# Patient Record
Sex: Female | Born: 1975 | Hispanic: No | Marital: Married | Smoking: Never smoker
Health system: Southern US, Community
[De-identification: ages and names within clinical notes are randomized; demographics above are authoritative.]

## PROBLEM LIST (undated history)

## (undated) ENCOUNTER — Inpatient Hospital Stay (HOSPITAL_COMMUNITY): Payer: Self-pay

## (undated) ENCOUNTER — Emergency Department (HOSPITAL_BASED_OUTPATIENT_CLINIC_OR_DEPARTMENT_OTHER): Admission: EM | Discharge status: Absent without leave | Payer: BLUE CROSS/BLUE SHIELD

## (undated) DIAGNOSIS — O09529 Supervision of elderly multigravida, unspecified trimester: Secondary | ICD-10-CM

## (undated) DIAGNOSIS — N764 Abscess of vulva: Secondary | ICD-10-CM

## (undated) DIAGNOSIS — N90810 Female genital mutilation status, unspecified: Secondary | ICD-10-CM

## (undated) DIAGNOSIS — O24419 Gestational diabetes mellitus in pregnancy, unspecified control: Secondary | ICD-10-CM

## (undated) DIAGNOSIS — I10 Essential (primary) hypertension: Secondary | ICD-10-CM

## (undated) HISTORY — DX: Abscess of vulva: N76.4

## (undated) HISTORY — DX: Female genital mutilation status, unspecified: N90.810

## (undated) HISTORY — DX: Essential (primary) hypertension: I10

## (undated) HISTORY — PX: NO PAST SURGERIES: SHX2092

## (undated) HISTORY — DX: Supervision of elderly multigravida, unspecified trimester: O09.529

---

## 2010-10-01 ENCOUNTER — Emergency Department (HOSPITAL_COMMUNITY)
Admission: EM | Admit: 2010-10-01 | Discharge: 2010-10-01 | Disposition: A | Discharge status: Home / Self Care | Payer: Self-pay | Attending: Emergency Medicine | Admitting: Emergency Medicine

## 2010-10-01 DIAGNOSIS — N9489 Other specified conditions associated with female genital organs and menstrual cycle: Secondary | ICD-10-CM | POA: Insufficient documentation

## 2010-10-01 DIAGNOSIS — N751 Abscess of Bartholin's gland: Secondary | ICD-10-CM | POA: Insufficient documentation

## 2011-02-21 ENCOUNTER — Other Ambulatory Visit (HOSPITAL_COMMUNITY): Payer: Self-pay | Admitting: Physician Assistant

## 2011-02-21 DIAGNOSIS — Z3689 Encounter for other specified antenatal screening: Secondary | ICD-10-CM

## 2011-02-21 LAB — RUBELLA ANTIBODY, IGM: Rubella: IMMUNE

## 2011-02-21 LAB — ABO/RH

## 2011-02-21 LAB — URINE CULTURE: Urine Culture, Routine: NO GROWTH

## 2011-02-21 LAB — CBC: Hemoglobin: 11.4 g/dL — AB (ref 12.0–16.0)

## 2011-02-21 LAB — HEPATITIS B SURFACE ANTIGEN: Hepatitis B Surface Ag: NEGATIVE

## 2011-02-21 LAB — HIV ANTIBODY (ROUTINE TESTING W REFLEX): HIV: NONREACTIVE

## 2011-03-06 DIAGNOSIS — Z2839 Other underimmunization status: Secondary | ICD-10-CM | POA: Insufficient documentation

## 2011-03-06 DIAGNOSIS — I10 Essential (primary) hypertension: Secondary | ICD-10-CM

## 2011-03-06 DIAGNOSIS — O169 Unspecified maternal hypertension, unspecified trimester: Secondary | ICD-10-CM | POA: Insufficient documentation

## 2011-03-06 DIAGNOSIS — O09899 Supervision of other high risk pregnancies, unspecified trimester: Secondary | ICD-10-CM | POA: Insufficient documentation

## 2011-03-06 DIAGNOSIS — N90810 Female genital mutilation status, unspecified: Secondary | ICD-10-CM

## 2011-03-06 DIAGNOSIS — O09529 Supervision of elderly multigravida, unspecified trimester: Secondary | ICD-10-CM

## 2011-03-07 ENCOUNTER — Ambulatory Visit (HOSPITAL_COMMUNITY)
Admission: RE | Admit: 2011-03-07 | Discharge: 2011-03-07 | Disposition: A | Discharge status: Home / Self Care | Payer: Medicaid Other | Source: Ambulatory Visit | Attending: Physician Assistant | Admitting: Physician Assistant

## 2011-03-07 DIAGNOSIS — Z3689 Encounter for other specified antenatal screening: Secondary | ICD-10-CM | POA: Insufficient documentation

## 2011-03-08 ENCOUNTER — Ambulatory Visit (INDEPENDENT_AMBULATORY_CARE_PROVIDER_SITE_OTHER): Payer: Medicaid Other | Admitting: Obstetrics & Gynecology

## 2011-03-08 VITALS — BP 130/85 | Temp 97.1°F | Wt 150.1 lb

## 2011-03-08 DIAGNOSIS — R030 Elevated blood-pressure reading, without diagnosis of hypertension: Secondary | ICD-10-CM

## 2011-03-08 LAB — POCT URINALYSIS DIP (DEVICE)
Bilirubin Urine: NEGATIVE
Hgb urine dipstick: NEGATIVE
Nitrite: NEGATIVE
pH: 6 (ref 5.0–8.0)

## 2011-03-08 LAB — COMPREHENSIVE METABOLIC PANEL
BUN: 7 mg/dL (ref 6–23)
CO2: 21 mEq/L (ref 19–32)
Calcium: 9.3 mg/dL (ref 8.4–10.5)
Chloride: 104 mEq/L (ref 96–112)
Creat: 0.37 mg/dL — ABNORMAL LOW (ref 0.50–1.10)
Glucose, Bld: 91 mg/dL (ref 70–99)

## 2011-03-08 NOTE — Progress Notes (Addendum)
Nutrition Note: Seen for 1st visit; pt receives [facility] services. Dx. HTN. Current wt gain of 5# plots 3#< expected for wks gestation. Pt reports good intake of 3 meals and 1-2 snacks daily. No reported food allergies and no N/V. Discussed wt gain goals of 25-35#. Pt will make try to increase snack intake to 2-3 small snacks daily. Follow up 4-6 weeks. Cy Blamer, RD

## 2011-03-08 NOTE — Progress Notes (Signed)
Received flu vaccine from [facility].  Pulse-98.

## 2011-03-08 NOTE — Progress Notes (Signed)
Referred due to borderline high BP 10/24 at [facility]. 139/89 that day. No previous Hx per Pt. Good FM. [place] 11/7 19.3, EDD 07/29/11. Could be seen in [facility] for now and watch for evidence of HTN. Baseline CMP today

## 2011-03-13 ENCOUNTER — Telehealth: Payer: Self-pay | Admitting: *Deleted

## 2011-03-13 NOTE — Telephone Encounter (Signed)
Pt left message stating that she has been having a cough since yesterday. She wants to know if her cough will upset her baby. I returned pt call and informed her that the cough will not upset or harm her baby. I also confirmed her EDD = 07/29/2011, now 20.2wks. I advised her that she may take the following OTC meds for cough; Robitussin DM, Delsym and Mucinex for chest congestion. Pt voiced understanding and had no further questions or c/o.

## 2011-03-29 ENCOUNTER — Ambulatory Visit (INDEPENDENT_AMBULATORY_CARE_PROVIDER_SITE_OTHER): Payer: Medicaid Other | Admitting: Obstetrics & Gynecology

## 2011-03-29 ENCOUNTER — Encounter: Payer: Self-pay | Admitting: *Deleted

## 2011-03-29 ENCOUNTER — Other Ambulatory Visit: Payer: Self-pay | Admitting: Obstetrics and Gynecology

## 2011-03-29 DIAGNOSIS — O359XX0 Maternal care for (suspected) fetal abnormality and damage, unspecified, not applicable or unspecified: Secondary | ICD-10-CM | POA: Insufficient documentation

## 2011-03-29 DIAGNOSIS — I10 Essential (primary) hypertension: Secondary | ICD-10-CM

## 2011-03-29 DIAGNOSIS — O09529 Supervision of elderly multigravida, unspecified trimester: Secondary | ICD-10-CM

## 2011-03-29 DIAGNOSIS — O0990 Supervision of high risk pregnancy, unspecified, unspecified trimester: Secondary | ICD-10-CM | POA: Insufficient documentation

## 2011-03-29 DIAGNOSIS — O358XX0 Maternal care for other (suspected) fetal abnormality and damage, not applicable or unspecified: Secondary | ICD-10-CM

## 2011-03-29 LAB — POCT URINALYSIS DIP (DEVICE)
Hgb urine dipstick: NEGATIVE
Ketones, ur: NEGATIVE mg/dL
Protein, ur: NEGATIVE mg/dL
Specific Gravity, Urine: 1.015 (ref 1.005–1.030)
Urobilinogen, UA: 0.2 mg/dL (ref 0.0–1.0)
pH: 6.5 (ref 5.0–8.0)

## 2011-03-29 NOTE — Progress Notes (Signed)
Need baseline labs and 24 hour urine.  Need fetal echo for ?VSD on [place].  BP stable.  No need to start antihypertensive meds yet.

## 2011-03-29 NOTE — Progress Notes (Signed)
Nutrition Note: (F/U on wt gain) Pt has gained 5 # since previous visit on (11/08).   Pt now has adequate wt gain @ [redacted]w[redacted]d gestation. Pt reports good appetite and no N/V.  Follow up if referred.  Cy Blamer, RD

## 2011-03-29 NOTE — Progress Notes (Signed)
P=125,C/o pelvic pain"sometimes"=1,

## 2011-03-29 NOTE — Progress Notes (Signed)
Fetal Echo scheduled with Dr. Elizebeth Brooking on !06/16/10 at 1030 am.

## 2011-04-04 ENCOUNTER — Encounter: Payer: Self-pay | Admitting: *Deleted

## 2011-04-09 ENCOUNTER — Telehealth: Payer: Self-pay | Admitting: *Deleted

## 2011-04-09 NOTE — Telephone Encounter (Signed)
Patient called and left a message on voicemail on Friday 04/06/11 stating would like to talk to a nurse about 24 hour urine. Called patient and left a message we are returning her call- please call back.

## 2011-04-10 NOTE — Telephone Encounter (Signed)
Called pt and left message stating that this is our second attempt if she still has questions to please give [place] a return call back.  Pt has appt on 04/12/11 @ 0900.  Will put in appt comments to check in basket.

## 2011-04-12 ENCOUNTER — Ambulatory Visit (INDEPENDENT_AMBULATORY_CARE_PROVIDER_SITE_OTHER): Payer: Medicaid Other | Admitting: Obstetrics & Gynecology

## 2011-04-12 DIAGNOSIS — O169 Unspecified maternal hypertension, unspecified trimester: Secondary | ICD-10-CM

## 2011-04-12 DIAGNOSIS — O358XX0 Maternal care for other (suspected) fetal abnormality and damage, not applicable or unspecified: Secondary | ICD-10-CM

## 2011-04-12 DIAGNOSIS — O0990 Supervision of high risk pregnancy, unspecified, unspecified trimester: Secondary | ICD-10-CM

## 2011-04-12 DIAGNOSIS — O359XX0 Maternal care for (suspected) fetal abnormality and damage, unspecified, not applicable or unspecified: Secondary | ICD-10-CM

## 2011-04-12 DIAGNOSIS — O09529 Supervision of elderly multigravida, unspecified trimester: Secondary | ICD-10-CM

## 2011-04-12 LAB — CBC
HCT: 36.2 % (ref 36.0–46.0)
Hemoglobin: 11.6 g/dL — ABNORMAL LOW (ref 12.0–15.0)
MCH: 25.7 pg — ABNORMAL LOW (ref 26.0–34.0)
MCHC: 32 g/dL (ref 30.0–36.0)
RBC: 4.51 MIL/uL (ref 3.87–5.11)

## 2011-04-12 LAB — POCT URINALYSIS DIP (DEVICE)
Hgb urine dipstick: NEGATIVE
Nitrite: NEGATIVE
Protein, ur: NEGATIVE mg/dL
Specific Gravity, Urine: 1.02 (ref 1.005–1.030)
Urobilinogen, UA: 0.2 mg/dL (ref 0.0–1.0)
pH: 6 (ref 5.0–8.0)

## 2011-04-12 LAB — COMPREHENSIVE METABOLIC PANEL
Albumin: 3.5 g/dL (ref 3.5–5.2)
Alkaline Phosphatase: 65 U/L (ref 39–117)
BUN: 7 mg/dL (ref 6–23)
CO2: 21 mEq/L (ref 19–32)
Glucose, Bld: 104 mg/dL — ABNORMAL HIGH (ref 70–99)
Sodium: 138 mEq/L (ref 135–145)
Total Bilirubin: 0.2 mg/dL — ABNORMAL LOW (ref 0.3–1.2)
Total Protein: 6.3 g/dL (ref 6.0–8.3)

## 2011-04-12 NOTE — Progress Notes (Signed)
Returned 24 hour urine today, will check labs. No preeclampsia symptoms.  No other complaints or concerns.  Fetal movement, blood pressure and labor precautions reviewed.

## 2011-04-12 NOTE — Progress Notes (Signed)
No vaginal discharge. Pulse 110. Pt returned 24 hour urine today and also needs lab work.

## 2011-04-13 ENCOUNTER — Encounter: Payer: Self-pay | Admitting: Obstetrics & Gynecology

## 2011-04-13 LAB — CREATININE CLEARANCE, URINE, 24 HOUR
Creatinine Clearance: 160 mL/min — ABNORMAL HIGH (ref 75–115)
Creatinine: 0.38 mg/dL — ABNORMAL LOW (ref 0.50–1.10)

## 2011-04-16 ENCOUNTER — Encounter: Payer: Self-pay | Admitting: *Deleted

## 2011-04-26 ENCOUNTER — Ambulatory Visit (INDEPENDENT_AMBULATORY_CARE_PROVIDER_SITE_OTHER): Payer: Medicaid Other | Admitting: Obstetrics and Gynecology

## 2011-04-26 ENCOUNTER — Other Ambulatory Visit: Payer: Self-pay | Admitting: Obstetrics and Gynecology

## 2011-04-26 DIAGNOSIS — O0990 Supervision of high risk pregnancy, unspecified, unspecified trimester: Secondary | ICD-10-CM

## 2011-04-26 DIAGNOSIS — O169 Unspecified maternal hypertension, unspecified trimester: Secondary | ICD-10-CM

## 2011-04-26 DIAGNOSIS — O09899 Supervision of other high risk pregnancies, unspecified trimester: Secondary | ICD-10-CM

## 2011-04-26 DIAGNOSIS — O358XX0 Maternal care for other (suspected) fetal abnormality and damage, not applicable or unspecified: Secondary | ICD-10-CM

## 2011-04-26 DIAGNOSIS — O359XX0 Maternal care for (suspected) fetal abnormality and damage, unspecified, not applicable or unspecified: Secondary | ICD-10-CM

## 2011-04-26 DIAGNOSIS — O09529 Supervision of elderly multigravida, unspecified trimester: Secondary | ICD-10-CM

## 2011-04-26 LAB — POCT URINALYSIS DIP (DEVICE)
Bilirubin Urine: NEGATIVE
Glucose, UA: NEGATIVE mg/dL
Hgb urine dipstick: NEGATIVE
Nitrite: NEGATIVE
Specific Gravity, Urine: 1.02 (ref 1.005–1.030)
Urobilinogen, UA: 0.2 mg/dL (ref 0.0–1.0)

## 2011-04-26 MED ORDER — CEPHALEXIN 500 MG PO CAPS: 500.0000 mg | ORAL_CAPSULE | Freq: Three times a day (TID) | ORAL | Status: AC

## 2011-04-26 NOTE — Progress Notes (Signed)
Pelvic pressure. Pulse 100. Pt states has abscess on left side of vaginal area and would liked that checked today.  No vaginal discharge.

## 2011-04-26 NOTE — Progress Notes (Signed)
Patient doing well. Reports an abscess on her left labia. Patient states it was present a few weeks ago and drained but now has returned. 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 1.5cm left labial cyst tender to touch located in upper aspect of labia majora, not ready to be drained. Will prescribe keflex. Patient advised to apply warm compresses to area in order to help form the abscess.

## 2011-05-01 NOTE — L&D Delivery Note (Signed)
Delivery Note At 11:54 PM a viable female was delivered via Vaginal, Spontaneous Delivery (Presentation: Left Occiput Anterior).  APGAR: 7, 9; weight 8 lb 5.9 oz (3795 g).   Placenta status: Intact, Spontaneous.  Cord: 3 vessels with the following complications: None.  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 Blood Loss (mL): 350  Mom to postpartum.  Baby to nursery-stable. Placenta to: Birthing Suites Feeding: Breast Circ: NA Contraception: unknown  2 cm cyst at anterior portion (over urethra) of female circumcision drain of white, mildly malodorous liquid. Sent for culture. Pt states she has had to have it drained several times.  Tytan Sandate 07/29/2011, 2:10 AM

## 2011-05-10 ENCOUNTER — Encounter: Payer: Self-pay | Admitting: *Deleted

## 2011-05-10 ENCOUNTER — Ambulatory Visit (INDEPENDENT_AMBULATORY_CARE_PROVIDER_SITE_OTHER): Payer: Medicaid Other | Admitting: Obstetrics & Gynecology

## 2011-05-10 VITALS — BP 125/78 | HR 105 | Temp 98.3°F | Wt 163.1 lb

## 2011-05-10 DIAGNOSIS — O169 Unspecified maternal hypertension, unspecified trimester: Secondary | ICD-10-CM

## 2011-05-10 LAB — CBC
HCT: 36 % (ref 36.0–46.0)
Hemoglobin: 11.5 g/dL — ABNORMAL LOW (ref 12.0–15.0)
MCH: 26 pg (ref 26.0–34.0)
MCHC: 31.9 g/dL (ref 30.0–36.0)
RBC: 4.43 MIL/uL (ref 3.87–5.11)

## 2011-05-10 LAB — POCT URINALYSIS DIP (DEVICE)
Bilirubin Urine: NEGATIVE
Glucose, UA: NEGATIVE mg/dL
Ketones, ur: NEGATIVE mg/dL
Leukocytes, UA: NEGATIVE
Nitrite: NEGATIVE

## 2011-05-10 NOTE — Progress Notes (Signed)
Edema traces in her feet.

## 2011-05-10 NOTE — Progress Notes (Signed)
Pt had irreg contractions 2 days ago, resolved. Her left labial abscess hasn't resolved but she opts to continue conservative management. 1cm l labial abscess, not tender,minimal swelling. Cervix long and closed

## 2011-05-10 NOTE — Patient Instructions (Signed)
Vulvar Abscess      The vulva is made up of the large and small flaps of skin around the vagina opening. A vulvar abscess is an infected sac of yellowish white fluid (pus) in the skin flaps. Your doctor may make a small cut in the skin to drain the vulvar abscess.   HOME CARE  · Only take medicine as told by your doctor.   · Soak or take a warm water bath (sitz bath) 3 to 4 times a day for 15 to 20 minutes.   · After you pee (urinate), always wipe from front to back.   · Clean the vulvar abscess with soap and warm water. Do this after going to the bathroom.   · Wear loose-fitting clothing.   · Do not have sex until the vulvar abscess is gone or as told by your doctor.   GET HELP RIGHT AWAY IF:   · You have a temperature by mouth above 102° F (38.9° C).   · The vulva area becomes more painful, puffy (swollen), or red.   · You have fluid coming from the vulva area that is red or tan.   · You have pain when you pee or have a hard time peeing.   MAKE SURE YOU:  · Understand these instructions.   · Will watch your condition.   · Will get help if you are not doing well or get worse.   Document Released: 07/13/2008 Document Revised: 12/27/2010 Document Reviewed: 07/13/2008  ExitCare® Patient Information ©2012 [employer].

## 2011-05-11 LAB — RPR

## 2011-05-11 LAB — HIV ANTIBODY (ROUTINE TESTING W REFLEX): HIV: NONREACTIVE

## 2011-05-24 ENCOUNTER — Ambulatory Visit (INDEPENDENT_AMBULATORY_CARE_PROVIDER_SITE_OTHER): Payer: Medicaid Other | Admitting: Obstetrics and Gynecology

## 2011-05-24 DIAGNOSIS — O99810 Abnormal glucose complicating pregnancy: Secondary | ICD-10-CM

## 2011-05-24 DIAGNOSIS — O0990 Supervision of high risk pregnancy, unspecified, unspecified trimester: Secondary | ICD-10-CM

## 2011-05-24 DIAGNOSIS — O169 Unspecified maternal hypertension, unspecified trimester: Secondary | ICD-10-CM

## 2011-05-24 DIAGNOSIS — O24419 Gestational diabetes mellitus in pregnancy, unspecified control: Secondary | ICD-10-CM

## 2011-05-24 DIAGNOSIS — O09529 Supervision of elderly multigravida, unspecified trimester: Secondary | ICD-10-CM

## 2011-05-24 DIAGNOSIS — N90810 Female genital mutilation status, unspecified: Secondary | ICD-10-CM

## 2011-05-24 LAB — POCT URINALYSIS DIP (DEVICE)
Bilirubin Urine: NEGATIVE
Glucose, UA: NEGATIVE mg/dL
Hgb urine dipstick: NEGATIVE
Specific Gravity, Urine: 1.01 (ref 1.005–1.030)
pH: 6.5 (ref 5.0–8.0)

## 2011-05-24 NOTE — Progress Notes (Signed)
Nutrition Note:  (Referred for newly dx GDM) Pt seen today for GDM diet instruction. Pt already familiar with diabetes diet due to mother having Type II. Pt given extensive instruction on CHO/Protein combo with 5-6 small meals.  Reviewed serving sizes and foods/beverages to avoid.  Pt will return Monday for meter instruction.  Pt completely confident on incorporating GDM diet into lifestyle.  Follow up if referred.  Cy Blamer, RD

## 2011-05-24 NOTE — Patient Instructions (Signed)
Gestational Diabetes Mellitus Gestational diabetes mellitus (GDM) is diabetes that occurs only during pregnancy. This happens when the body cannot properly handle the glucose (sugar) that increases in the blood after eating. During pregnancy, insulin resistance (reduced sensitivity to insulin) occurs because of the release of hormones from the placenta. Usually, the pancreas of pregnant women produces enough insulin to overcome the resistance that occurs. However, in gestational diabetes, the insulin is there but it does not work effectively. If the resistance is severe enough that the pancreas does not produce enough insulin, extra glucose builds up in the blood.  WHO IS AT RISK FOR DEVELOPING GESTATIONAL DIABETES?  Women with a history of diabetes in the family.   Women over age 25.   Women who are overweight.   Women in certain ethnic groups (Hispanic, African American, Native American, Asian and Pacific Islander).  WHAT CAN HAPPEN TO THE BABY? If the mother's blood glucose is too high while she is pregnant, the extra sugar will travel through the umbilical cord to the baby. Some of the problems the baby may have are:  Large Baby - If the baby receives too much sugar, the baby will gain more weight. This may cause the baby to be too large to be born normally (vaginally) and a Cesarean section (C-section) may be needed.   Low Blood Glucose (hypoglycemia) - The baby makes extra insulin, in response to the extra sugar its gets from its mother. When the baby is born and no longer needs this extra insulin, the baby's blood glucose level may drop.   Jaundice (yellow coloring of the skin and eyes) - This is fairly common in babies. It is caused from a build-up of the chemical called bilirubin. This is rarely serious, but is seen more often in babies whose mothers had gestational diabetes.  RISKS TO THE MOTHER Women who have had gestational diabetes may be at higher risk for some problems,  including:  Preeclampsia or toxemia, which includes problems with high blood pressure. Blood pressure and protein levels in the urine must be checked frequently.   Infections.   Cesarean section (C-section) for delivery.   Developing Type 2 diabetes later in life. About 30-50% will develop diabetes later, especially if obese.  DIAGNOSIS  The hormones that cause insulin resistance are highest at about 24-28 weeks of pregnancy. If symptoms are experienced, they are much like symptoms you would normally expect during pregnancy.  GDM is often diagnosed using a two part method: 1. After 24-28 weeks of pregnancy, the woman drinks a glucose solution and takes a blood test. If the glucose level is high, a second test will be given.  2. Oral Glucose Tolerance Test (OGTT) which is 3 hours long - After not eating overnight, the blood glucose is checked. The woman drinks a glucose solution, and hourly blood glucose tests are taken.  If the woman has risk factors for GDM, the caregiver may test earlier than 24 weeks of pregnancy. TREATMENT  Treatment of GDM is directed at keeping the mother's blood glucose level normal, and may include:  Meal planning.   Taking insulin or other medicine to control your blood glucose level.   Exercise.   Keeping a daily record of the foods you eat.   Blood glucose monitoring and keeping a record of your blood glucose levels.   May monitor ketone levels in the urine, although this is no longer considered necessary in most pregnancies.  HOME CARE INSTRUCTIONS  While you are pregnant:    Follow your caregiver's advice regarding your prenatal appointments, meal planning, exercise, medicines, vitamins, blood and other tests, and physical activities.   Keep a record of your meals, blood glucose tests, and the amount of insulin you are taking (if any). Show this to your caregiver at every prenatal visit.   If you have GDM, you may have problems with hypoglycemia (low  blood glucose). You may suspect this if you become suddenly dizzy, feel shaky, and/or weak. If you think this is happening and you have a glucose meter, try to test your blood glucose level. Follow your caregiver's advice for when and how to treat your low blood glucose. Generally, the 15:15 rule is followed: Treat by consuming 15 grams of carbohydrates, wait 15 minutes, and recheck blood glucose. Examples of 15 grams of carbohydrates are:   1 cup skim or low-fat milk.    cup juice.   3-4 glucose tablets.   5-6 hard candies.   1 small box raisins.    cup regular soda pop.   Practice good hygiene, to avoid infections.   Do not smoke.  SEEK MEDICAL CARE IF:   You develop abnormal vaginal discharge, with or without itching.   You become weak and tired more than expected.   You seem to sweat a lot.   You have a sudden increase in weight, 5 pounds or more in one week.   You are losing weight, 3 pounds or more in a week.   Your blood glucose level is high, and you need instructions on what to do about it.  SEEK IMMEDIATE MEDICAL CARE IF:   You develop a severe headache.   You faint or pass out.   You develop nausea and vomiting.   You become disoriented or confused.   You have a convulsion.   You develop vision problems.   You develop stomach pain.   You develop vaginal bleeding.   You develop uterine contractions.   You have leaking or a gush of fluid from the vagina.  AFTER YOU HAVE THE BABY:  Go to all of your follow-up appointments, and have blood tests as advised by your caregiver.   Maintain a healthy lifestyle, to prevent diabetes in the future. This includes:   Following a healthy meal plan.   Controlling your weight.   Getting enough exercise and proper rest.   Do not smoke.   Breastfeed your baby if you can. This will lower the chance of you and your baby developing diabetes later in life.  For more information about diabetes, go to the American  Diabetes Association at: www.americandiabetesassociation.org. For more information about gestational diabetes, go to the American Congress of Obstetricians and Gynecologists at: www.acog.org. Document Released: 07/23/2000 Document Revised: 12/27/2010 Document Reviewed: 02/14/2009 ExitCare Patient Information 2012 [employer]. 

## 2011-05-24 NOTE — Progress Notes (Signed)
Patient doing well without complaints. Informed of diagnosis of gestational diabetes along with fetal/neonatal risk if glycemic control is not well maintained during pregnancy. All questions answered. Patient to return on Monday for diabetic counseling with [facility]. Patient to be seen by Nutritionist today. Patient to return on 2/4 for f/u Ob with [facility] log.

## 2011-05-28 ENCOUNTER — Encounter: Payer: Medicaid Other | Attending: Obstetrics & Gynecology | Admitting: Dietician

## 2011-05-28 ENCOUNTER — Ambulatory Visit (INDEPENDENT_AMBULATORY_CARE_PROVIDER_SITE_OTHER): Payer: Medicaid Other | Admitting: Family Medicine

## 2011-05-28 DIAGNOSIS — Z713 Dietary counseling and surveillance: Secondary | ICD-10-CM | POA: Insufficient documentation

## 2011-05-28 DIAGNOSIS — O99810 Abnormal glucose complicating pregnancy: Secondary | ICD-10-CM | POA: Insufficient documentation

## 2011-05-28 NOTE — Progress Notes (Signed)
Diabetes Education:  Has completed diet instruction with Randal Buba. RD.  Today, I provided the [facility] Meter, Accu Chek Smartview Kit Lot [id number] Exp: 03/031/2014.  We reviewed the meter and testing times and record keeping.  She completed a return demonstration of the meter.  With breakfast at 20 minutes age, her glucose was 137 mg.  She is to bring meter and log book to all clinic appoints. Maggie Kavian Peters, RN, RD, CDE

## 2011-06-04 ENCOUNTER — Telehealth: Payer: Self-pay | Admitting: *Deleted

## 2011-06-04 MED ORDER — GLUCOSE BLOOD VI STRP: ORAL_STRIP | Status: DC

## 2011-06-04 MED ORDER — ACCU-CHEK FASTCLIX LANCETS MISC: 1.0000 | Freq: Four times a day (QID) | Status: DC

## 2011-06-04 NOTE — Telephone Encounter (Signed)
Pt left message stating that she was told by her pharmacy that the doctor needs to write Rx for her diabetes strips.

## 2011-06-04 NOTE — Telephone Encounter (Signed)
I returned pt call and left message on her personal voice mail that I have sent to Rx electronically to her pharmacy. She will have Rx for the strips and the lancets. She may call back if she has questions.

## 2011-06-07 ENCOUNTER — Ambulatory Visit (INDEPENDENT_AMBULATORY_CARE_PROVIDER_SITE_OTHER): Payer: Medicaid Other | Admitting: Obstetrics & Gynecology

## 2011-06-07 DIAGNOSIS — O99810 Abnormal glucose complicating pregnancy: Secondary | ICD-10-CM

## 2011-06-07 DIAGNOSIS — O09529 Supervision of elderly multigravida, unspecified trimester: Secondary | ICD-10-CM

## 2011-06-07 DIAGNOSIS — O24419 Gestational diabetes mellitus in pregnancy, unspecified control: Secondary | ICD-10-CM

## 2011-06-07 LAB — POCT URINALYSIS DIP (DEVICE)
Bilirubin Urine: NEGATIVE
Hgb urine dipstick: NEGATIVE
Protein, ur: NEGATIVE mg/dL
pH: 6 (ref 5.0–8.0)

## 2011-06-07 NOTE — Patient Instructions (Signed)
Monitoring for Diabetes There are two blood tests that help you monitor and manage your diabetes. These include:  An A1c (hemoglobin A1c) test.   This test is an average of your glucose (or blood sugar) control over the past 3 months.   This is recommended as a way for you and your caregiver to understand how well your glucose levels are controlled on the average.   Your A1c goal will be determined by your caregiver, but it is usually best if it is less than 6.5% to 7.0%.   Glucose (sugar) attaches itself to red blood cells. The amount of glucose then can then be measured. The amount of glucose on the cells depends on how high your blood glucose has been.   SMBG test (self-monitoring blood glucose).   Using a blood glucose monitor (meter) to do SMBG testing is an easy way to monitor the amount of glucose in your blood and can help you improve your control. The monitor will tell you what your blood glucose is at that very moment. Every person with diabetes should have a blood glucose monitor and know how to use it. The better you control your blood sugar on a daily basis, the better your A1c levels will be.  HOW OFTEN SHOULD I HAVE AN A1C LEVEL?  Every 3 months if your diabetes is not well controlled or if therapy has changed.   Every 6 months if you are meeting your treatment goals.  HOW OFTEN SHOULD I DO SMBG TESTING?  Your caregiver will recommend how often you should test. Testing times are based on the kind of medicine you take, type of diabetes you have, and your blood glucose control. Testing times can include:  Type 1 diabetes: test 3 or 4 times a day or as directed.   Type 2 diabetes and if you are taking insulin and diabetes pills: test 3 or 4 times a day or as directed.   If you are taking diabetes pills only and not reaching your target A1c: test 2 to 4 times a day or as directed.   If you are taking diabetes pills and are controlling your diabetes well with diet and  exercise, your caregiver will help you decide what is appropriate.  WHAT TIME OF DAY SHOULD I TEST?  The best time of day to test your blood glucose depends on medications, mealtimes, exercise, and blood glucose control. It is best to test at different times because this will help you know how you are doing throughout the day. Your caregiver will help you decide what is best. WHAT SHOULD MY BLOOD GLUCOSE BE? Blood glucose target goals may vary depending on each persons needs, whether they have type 1 or type 2 diabetes or what medications they are taking. However, as a general rule, blood glucose should be:  Before meals...70-130 mg/dl.   After meals .....less than 180 mg/dl.  CHECK YOUR BLOOD GLUCOSE IF:  You have symptoms of low blood sugar (hypoglycemia), which may include dizziness, shaking, sweating, chills and confusion.   You have symptoms of high blood sugar (hyperglycemia), which may include sleepiness, blurred vision, frequent urination and excessive thirst.   You are learning how meals, physical activity and medicine affect your blood glucose level. The more you learn about how various foods, your medications, and activities affect you, the better job you will do of taking care of yourself.   You have a job in which poor control could cause safety problems while driving or operating   machinery.  CHECK YOUR BLOOD SUGAR MORE FREQUENTLY:  If you have medication or dietary changes.   If you begin taking other kinds of medicines.   If you become sick or your level of stress increases. With an illness, your blood sugar may even be high without eating.   Before and after exercise.  Follow your caregiver's testing recommendations during this time.  TO DISPOSE OF SHARPS: Each city or state may have different regulations. Check with your public works or waste management department.  Sharps containers can be purchased from pharmacies   Place all used sharps in a container. You do not  need to replace any protective covers over the needle or break the needle.   Sharps should be contained in a ridge, leakproof, puncture-resistant container.   Plastic detergent bottle.   Bleach bottle.   When container is almost full, add a solution that is 1 part laundry bleach and 9 parts tap water (it is ok to use undiluted bleach if you wish). You may want to wear gloves since bleach can damage tissue. Let the solution sit for 30 minutes.   Carefully pour all the liquid into the sanitary sewer. Be sure to prevent the sharps from falling out.   Once liquid is drained, reseal the container with lid and tape it shut with duct tape. This will prevent the cap from coming off.   Dispose of the container with your regular household trash and waste. It is a good idea to let your trash hauler know that you will be disposing of sharps.  Document Released: 04/19/2003 Document Revised: 12/27/2010 Document Reviewed: 10/18/2008 ExitCare Patient Information 2012 [employer]. 

## 2011-06-07 NOTE — Progress Notes (Signed)
Edema- feet.  Pulse- 104.  Pain- "upper stomach"

## 2011-06-07 NOTE — Progress Notes (Signed)
Only had 3 days of test strips, needs RX. FBS 95-101, pp BS 82-143.

## 2011-06-18 ENCOUNTER — Encounter: Payer: Self-pay | Admitting: Family Medicine

## 2011-06-18 ENCOUNTER — Ambulatory Visit (INDEPENDENT_AMBULATORY_CARE_PROVIDER_SITE_OTHER): Payer: Medicaid Other | Admitting: Family Medicine

## 2011-06-18 DIAGNOSIS — O99810 Abnormal glucose complicating pregnancy: Secondary | ICD-10-CM

## 2011-06-18 DIAGNOSIS — O09529 Supervision of elderly multigravida, unspecified trimester: Secondary | ICD-10-CM

## 2011-06-18 DIAGNOSIS — O09519 Supervision of elderly primigravida, unspecified trimester: Secondary | ICD-10-CM

## 2011-06-18 LAB — POCT URINALYSIS DIP (DEVICE)
Hgb urine dipstick: NEGATIVE
Ketones, ur: NEGATIVE mg/dL
Protein, ur: NEGATIVE mg/dL
Specific Gravity, Urine: 1.015 (ref 1.005–1.030)
Urobilinogen, UA: 0.2 mg/dL (ref 0.0–1.0)
pH: 7 (ref 5.0–8.0)

## 2011-06-18 MED ORDER — GLYBURIDE 2.5 MG PO TABS: 2.5000 mg | ORAL_TABLET | Freq: Every day | ORAL | Status: DC

## 2011-06-18 NOTE — Patient Instructions (Signed)
Fetal Monitoring, Nonstress Test The nonstress test (NST) is a procedure that monitors the increase of the fetal heart rate in response to the fetal movement. An increase in the fetal heart rate during fetal movement shows that the pregnancy is normal. The increase also shows the well-being of the baby's central nervous system. Fetal activity may be spontaneous, induced a by uterine contraction, or induced by external stimulation with an artificial voice box. Oxytocin stimulation is not used in this procedure. This test is also done to see if there are problems with the pregnancy and the baby. Identifying and correcting problems may prevent serious problems from developing with the fetus, including fetal loss.  OTHER TECHNIQUES OF MONITORING YOUR BABY PRIOR TO BIRTH:  Fetal movement assessment. This is done by the pregnant woman herself by counting and recording the baby's movements over a certain period of theme.   Contraction stress test (CST). This test monitors the baby's heart rate during a contraction of the uterus.   Fetal biophysical profile (BPP). This measures and evaluates 5 observations of the baby:   The nonstress test.   The baby's breathing.   The baby's movements.   The baby's muscle tone.   The amount of fluid in the amniotic sac.   Modified biophysical profile. This measures the volume of fluid in different parts of the amniotic sac (amniotic fluid index) and the results of the nonstress test.   Umbilical artery doppler velocimetry. This evaluates the blood flow through the umbilical cord.  There are several very serious problems that cannot be predicted or detected with any of the fetal monitoring procedures. These problems include separation (abruption) of the placenta and choking of the baby with the umbilical cord (umbilical cord accident). Your caregiver will help you understand the tests and what they mean for you and your baby. It is your responsibility to obtain the  results of your test. LET YOUR CAREGIVER KNOW ABOUT:  Any medications you are taking including prescription and over-the-counter drugs, herbs, eye drops and creams.   If you have a fever.   If you have an infection.   If you are sick.  RISKS AND COMPLICATIONS  There are no risks or complications to the mother or fetus with this test. BEFORE THE PROCEDURE  Do not take medications that may increase or decrease the baby's heart rate and/or movements.   Have a full meal at least 2 hours before the test.   Do not smoke if you are pregnant. If you smoke, stop at least 2 days before the test. It is a good idea not to smoke at all when you are pregnant.  PROCEDURE The NST is based on the idea that the heart rate of a normal baby will speed up while the baby is moving around. This is an indicator of a normal working pregnancy. Loss of movement is seen commonly while the baby is sleeping or if there are problems in the pregnancy. Smoking may hurt test results.  With the patient lying on her side, the fetal heart rate is checked with an electrode on the belly.   The line drawn from a recording instrument (tracing) is observed for fetal heart rate accelerations that speed up at least 15 beats per minute above resting, and last 15 seconds. Tracings of 40 minutes or longer may be necessary.   Sound stimulation of the healthy fetus may speed up a baby's heart. This also means the baby is healthy. Stimulation helps to reduce testing time  and helps find problems in the pregnancy if there is any. To do this, an artificial voice box is put on the mother's belly for about 12 seconds. This may be repeated up to 3 times for progressively longer durations.   Results are categorized as normal (reactive) or abnormal (nonreactive). Commonly, the NST is considered normal (reactive) if there are 2 or more fetal heart rate accelerations as described inside a 20-minute period. This is with or without fetal movement  the mother feels. A nonreactive NST is one that does not have enough fetal heart rate accelerations over a 40 minute period.   Abnormal testing may need further testing.   Your caregiver will help you understand your tests and what they mean for you and your baby.  It should be noted also that:  The NST can be nonreactive 50% of the time in weeks 24 to 28 of a normal pregnancy.   The NST can be nonreactive 15% of the time in weeks 28 to 32 of a normal pregnancy.   Lower fetal heart rate (decelerations) may be seen in 50% of NST's, but if they are consistent (3 in 20 minutes), it increases the risk for Cesarean section.   Decelerations of the fetal heart rate that last for one minute or longer indicates a very serious problem with the baby and a Cesarean section may be needed right away.  AFTER THE PROCEDURE  You may go home and resume your usual activities, or as directed by your caregiver. HOME CARE INSTRUCTIONS   Follow your caregiver's advice and recommendations.   Beware of your baby's movements. Are they normal, less than usual or more than usual?   Make and keep the rest of your prenatal appointments.  SEEK MEDICAL CARE IF:   You develop a temperature of 100 F (37.9 C) or higher.   You have a bloody mucus discharge from the vagina (a bloody show).  SEEK IMMEDIATE MEDICAL CARE IF:   You do not feel the baby move.   You think the baby's movements are less than usual or more than usual.   You develop contractions.   You develop vaginal bleeding.   You develop belly (abdominal) pain.   You have leaking or a gush of fluid from the vagina.  Document Released: 04/06/2002 Document Revised: 12/27/2010 Document Reviewed: 08/10/2008 [facility] Patient Information 2012 [place], [place].

## 2011-06-18 NOTE — Progress Notes (Signed)
Pain across abdomen on/off especially when the baby moves X 40month described as cramping 5/10 in pain scale. States not a contraction. Vaginal d/c described as thin, and white.

## 2011-06-18 NOTE — Progress Notes (Signed)
Fasting CBGs 91-108.  PP CBGs < 120. Glyburide 2.5mg  QHS No contractions, abnormal vaginal discharge.  F/u 1 week.  Start NST on Thursday.

## 2011-06-21 ENCOUNTER — Ambulatory Visit (INDEPENDENT_AMBULATORY_CARE_PROVIDER_SITE_OTHER): Payer: Medicaid Other | Admitting: *Deleted

## 2011-06-21 DIAGNOSIS — O99810 Abnormal glucose complicating pregnancy: Secondary | ICD-10-CM

## 2011-06-21 DIAGNOSIS — O169 Unspecified maternal hypertension, unspecified trimester: Secondary | ICD-10-CM

## 2011-06-21 DIAGNOSIS — O24419 Gestational diabetes mellitus in pregnancy, unspecified control: Secondary | ICD-10-CM

## 2011-06-21 NOTE — Progress Notes (Signed)
P = 107   Pt is not aware of UC's during NST. She declined offer to have cervix examined.   Labor sx reviewed.

## 2011-06-22 NOTE — Progress Notes (Signed)
NST 06/21/11 reviewed, reactive  

## 2011-06-25 ENCOUNTER — Ambulatory Visit (INDEPENDENT_AMBULATORY_CARE_PROVIDER_SITE_OTHER): Payer: Medicaid Other | Admitting: Obstetrics and Gynecology

## 2011-06-25 DIAGNOSIS — O09529 Supervision of elderly multigravida, unspecified trimester: Secondary | ICD-10-CM

## 2011-06-25 DIAGNOSIS — O24419 Gestational diabetes mellitus in pregnancy, unspecified control: Secondary | ICD-10-CM

## 2011-06-25 DIAGNOSIS — O169 Unspecified maternal hypertension, unspecified trimester: Secondary | ICD-10-CM

## 2011-06-25 DIAGNOSIS — O99810 Abnormal glucose complicating pregnancy: Secondary | ICD-10-CM

## 2011-06-25 LAB — POCT URINALYSIS DIP (DEVICE)
Bilirubin Urine: NEGATIVE
Glucose, UA: NEGATIVE mg/dL
Leukocytes, UA: NEGATIVE
Nitrite: NEGATIVE

## 2011-06-25 NOTE — Progress Notes (Signed)
Pressure- lower pelvic when standing long periods Pain-lower back.  Pulse- 113

## 2011-06-25 NOTE — Progress Notes (Signed)
NST 2/25 reviewed-Category I. CBG (f):68-101 (majority within range); 2hr pp 70-140 (majority within range). Will continue current glycemic regimen. Patient planning on breastfeeding and Nexplanon for pp BCM. FM/PTL precautions reviewed.

## 2011-06-28 ENCOUNTER — Ambulatory Visit (INDEPENDENT_AMBULATORY_CARE_PROVIDER_SITE_OTHER): Payer: Medicaid Other | Admitting: *Deleted

## 2011-06-28 VITALS — BP 120/79 | Wt 166.6 lb

## 2011-06-28 DIAGNOSIS — O169 Unspecified maternal hypertension, unspecified trimester: Secondary | ICD-10-CM

## 2011-06-28 DIAGNOSIS — O24419 Gestational diabetes mellitus in pregnancy, unspecified control: Secondary | ICD-10-CM

## 2011-06-28 DIAGNOSIS — O99810 Abnormal glucose complicating pregnancy: Secondary | ICD-10-CM

## 2011-06-28 NOTE — Progress Notes (Signed)
P = 98 

## 2011-07-02 ENCOUNTER — Ambulatory Visit (INDEPENDENT_AMBULATORY_CARE_PROVIDER_SITE_OTHER): Payer: Medicaid Other | Admitting: Obstetrics & Gynecology

## 2011-07-02 VITALS — BP 118/85 | HR 105 | Temp 98.1°F | Wt 166.1 lb

## 2011-07-02 DIAGNOSIS — Z283 Underimmunization status: Secondary | ICD-10-CM

## 2011-07-02 DIAGNOSIS — O99810 Abnormal glucose complicating pregnancy: Secondary | ICD-10-CM

## 2011-07-02 DIAGNOSIS — O24419 Gestational diabetes mellitus in pregnancy, unspecified control: Secondary | ICD-10-CM

## 2011-07-02 DIAGNOSIS — O169 Unspecified maternal hypertension, unspecified trimester: Secondary | ICD-10-CM

## 2011-07-02 DIAGNOSIS — O09529 Supervision of elderly multigravida, unspecified trimester: Secondary | ICD-10-CM

## 2011-07-02 DIAGNOSIS — O09899 Supervision of other high risk pregnancies, unspecified trimester: Secondary | ICD-10-CM

## 2011-07-02 DIAGNOSIS — N90810 Female genital mutilation status, unspecified: Secondary | ICD-10-CM

## 2011-07-02 LAB — POCT URINALYSIS DIP (DEVICE)
Glucose, UA: NEGATIVE mg/dL
Ketones, ur: 15 mg/dL — AB
Leukocytes, UA: NEGATIVE

## 2011-07-02 NOTE — Patient Instructions (Signed)
Return to clinic for any obstetric concerns or go to MAU for evaluation  

## 2011-07-02 NOTE — Progress Notes (Signed)
CBGs mostly 70s-90s, but two values 120s.  Breakfast PP in 130s-140s, isolated elevated PP at dinner.  Will increase Glyburide to 2.5 mg qam, 3.75 mg qhs.  Reevaluate at next visit, hypoglycemic precautions reviewed.  NST performed on 07/02/2011 was reviewed and was found to be reactive.  Continue recommended antenatal testing and prenatal care. Pelvic cultures next visit.

## 2011-07-02 NOTE — Progress Notes (Signed)
Pulse 105. No c/o pain. Pressure on pelvic.

## 2011-07-04 NOTE — Progress Notes (Signed)
NST reactive on 06/28/11 

## 2011-07-05 ENCOUNTER — Ambulatory Visit (INDEPENDENT_AMBULATORY_CARE_PROVIDER_SITE_OTHER): Payer: Medicaid Other | Admitting: *Deleted

## 2011-07-05 DIAGNOSIS — O169 Unspecified maternal hypertension, unspecified trimester: Secondary | ICD-10-CM

## 2011-07-05 DIAGNOSIS — O09529 Supervision of elderly multigravida, unspecified trimester: Secondary | ICD-10-CM

## 2011-07-05 DIAGNOSIS — O24419 Gestational diabetes mellitus in pregnancy, unspecified control: Secondary | ICD-10-CM

## 2011-07-05 DIAGNOSIS — O99810 Abnormal glucose complicating pregnancy: Secondary | ICD-10-CM

## 2011-07-05 NOTE — Progress Notes (Signed)
P = 107 

## 2011-07-09 ENCOUNTER — Encounter: Payer: Self-pay | Admitting: Family Medicine

## 2011-07-09 ENCOUNTER — Ambulatory Visit (INDEPENDENT_AMBULATORY_CARE_PROVIDER_SITE_OTHER): Payer: Medicaid Other | Admitting: Family Medicine

## 2011-07-09 VITALS — BP 130/87 | HR 106 | Temp 97.0°F | Wt 167.9 lb

## 2011-07-09 DIAGNOSIS — O24419 Gestational diabetes mellitus in pregnancy, unspecified control: Secondary | ICD-10-CM

## 2011-07-09 DIAGNOSIS — O99810 Abnormal glucose complicating pregnancy: Secondary | ICD-10-CM

## 2011-07-09 DIAGNOSIS — O169 Unspecified maternal hypertension, unspecified trimester: Secondary | ICD-10-CM

## 2011-07-09 LAB — POCT URINALYSIS DIP (DEVICE)
Glucose, UA: NEGATIVE mg/dL
Ketones, ur: 15 mg/dL — AB
Specific Gravity, Urine: >=1.03 (ref 1.005–1.030)
Urobilinogen, UA: 0.2 mg/dL (ref 0.0–1.0)

## 2011-07-09 LAB — STREP B DNA PROBE: GBS: NEGATIVE

## 2011-07-09 NOTE — Progress Notes (Signed)
Pulse: 106

## 2011-07-09 NOTE — Progress Notes (Signed)
FBS-55-101 2 hr pp-74-141. 3 of 16 out of range. Cultures today NST reviewed and reactive.

## 2011-07-09 NOTE — Patient Instructions (Signed)

## 2011-07-10 LAB — GC/CHLAMYDIA PROBE AMP, GENITAL: Chlamydia, DNA Probe: NEGATIVE

## 2011-07-12 ENCOUNTER — Ambulatory Visit (INDEPENDENT_AMBULATORY_CARE_PROVIDER_SITE_OTHER): Payer: Medicaid Other | Admitting: *Deleted

## 2011-07-12 VITALS — BP 132/78 | Wt 167.8 lb

## 2011-07-12 DIAGNOSIS — O99810 Abnormal glucose complicating pregnancy: Secondary | ICD-10-CM

## 2011-07-12 DIAGNOSIS — O24419 Gestational diabetes mellitus in pregnancy, unspecified control: Secondary | ICD-10-CM

## 2011-07-12 LAB — CULTURE, BETA STREP (GROUP B ONLY)

## 2011-07-12 NOTE — Progress Notes (Signed)
P - 104 

## 2011-07-12 NOTE — Progress Notes (Signed)
NST performed on 07/12/2011 was reviewed and was found to be reactive.  AFI normal at 9.2 cm.  Continue recommended antenatal testing and prenatal care.

## 2011-07-16 ENCOUNTER — Encounter (HOSPITAL_COMMUNITY): Payer: Self-pay | Admitting: *Deleted

## 2011-07-16 ENCOUNTER — Telehealth (HOSPITAL_COMMUNITY): Payer: Self-pay | Admitting: *Deleted

## 2011-07-16 ENCOUNTER — Ambulatory Visit (INDEPENDENT_AMBULATORY_CARE_PROVIDER_SITE_OTHER): Payer: Medicaid Other | Admitting: Obstetrics and Gynecology

## 2011-07-16 VITALS — BP 128/88 | Temp 97.1°F | Wt 170.9 lb

## 2011-07-16 DIAGNOSIS — O99810 Abnormal glucose complicating pregnancy: Secondary | ICD-10-CM

## 2011-07-16 DIAGNOSIS — O09529 Supervision of elderly multigravida, unspecified trimester: Secondary | ICD-10-CM

## 2011-07-16 DIAGNOSIS — O169 Unspecified maternal hypertension, unspecified trimester: Secondary | ICD-10-CM

## 2011-07-16 DIAGNOSIS — O24419 Gestational diabetes mellitus in pregnancy, unspecified control: Secondary | ICD-10-CM

## 2011-07-16 LAB — POCT URINALYSIS DIP (DEVICE)
Hgb urine dipstick: NEGATIVE
Ketones, ur: 40 mg/dL — AB
Protein, ur: NEGATIVE mg/dL
Specific Gravity, Urine: 1.015 (ref 1.005–1.030)
pH: 6.5 (ref 5.0–8.0)

## 2011-07-16 LAB — FETAL NONSTRESS TEST

## 2011-07-16 NOTE — Telephone Encounter (Signed)
Preadmission screen  

## 2011-07-16 NOTE — Progress Notes (Signed)
NSt reviewed and reactive. Patient doing well without complaints. CBG f- 67-91  2hr pp72-159 (2/15 abnormal) Continue twice weekly NST. Will schedule induction of labor at 39 weeks. F/U growth ultrasound on Thursday.

## 2011-07-16 NOTE — Progress Notes (Signed)
Edema-feet. No vaginal discharge. Pulse 114. 

## 2011-07-19 ENCOUNTER — Ambulatory Visit (INDEPENDENT_AMBULATORY_CARE_PROVIDER_SITE_OTHER): Payer: Medicaid Other | Admitting: *Deleted

## 2011-07-19 ENCOUNTER — Inpatient Hospital Stay (HOSPITAL_COMMUNITY)
Admission: AD | Admit: 2011-07-19 | Discharge: 2011-07-19 | Disposition: A | Discharge status: Home / Self Care | Payer: Medicaid Other | Source: Ambulatory Visit | Attending: Obstetrics and Gynecology | Admitting: Obstetrics and Gynecology

## 2011-07-19 ENCOUNTER — Ambulatory Visit (HOSPITAL_COMMUNITY)
Admission: RE | Admit: 2011-07-19 | Discharge: 2011-07-19 | Disposition: A | Discharge status: Home / Self Care | Payer: Medicaid Other | Source: Ambulatory Visit | Attending: Family Medicine | Admitting: Family Medicine

## 2011-07-19 ENCOUNTER — Encounter (HOSPITAL_COMMUNITY): Payer: Self-pay | Admitting: *Deleted

## 2011-07-19 VITALS — BP 125/81 | Wt 169.7 lb

## 2011-07-19 DIAGNOSIS — O09529 Supervision of elderly multigravida, unspecified trimester: Secondary | ICD-10-CM | POA: Insufficient documentation

## 2011-07-19 DIAGNOSIS — O479 False labor, unspecified: Secondary | ICD-10-CM | POA: Insufficient documentation

## 2011-07-19 DIAGNOSIS — O169 Unspecified maternal hypertension, unspecified trimester: Secondary | ICD-10-CM

## 2011-07-19 DIAGNOSIS — O368390 Maternal care for abnormalities of the fetal heart rate or rhythm, unspecified trimester, not applicable or unspecified: Secondary | ICD-10-CM | POA: Insufficient documentation

## 2011-07-19 DIAGNOSIS — O24419 Gestational diabetes mellitus in pregnancy, unspecified control: Secondary | ICD-10-CM

## 2011-07-19 DIAGNOSIS — O10019 Pre-existing essential hypertension complicating pregnancy, unspecified trimester: Secondary | ICD-10-CM | POA: Insufficient documentation

## 2011-07-19 LAB — FETAL NONSTRESS TEST

## 2011-07-19 NOTE — Progress Notes (Signed)
P = 89  Pt had growth [place] today, EFW = 75%, AFI = 10.21cm.  Dr. Jolayne Panther paged to review tracing due to FHR decel. Pt informed of plan of care - voiced understanding.  Pt taken to MAU for prolonged monitoring after NST.

## 2011-07-19 NOTE — Discharge Instructions (Signed)
Please rest as much as possible. If anything changes including vaginal bleeding, discharge, loss of fluid or decreased fetal movement, please return to Maternity Admissions Unit to be evaluated.  Keep your appointment at [facility] next week.

## 2011-07-19 NOTE — MAU Note (Signed)
Pt sent from clinic for monitoring - pt had decel earlier today.

## 2011-07-19 NOTE — MAU Provider Note (Signed)
History     CSN: [id number]  Arrival date and time: 07/19/11 1518   First Provider Initiated Contact with Patient 07/19/11 1600      Chief Complaint  Patient presents with  . Non-stress Test   HPI Patient is a 36 yo G2P0010 at [redacted]w[redacted]d presenting to MAU from high risk clinic for NST and observation on monitor. Patient had a decel noted in clinic today. Dr. Jolayne Panther reviewed the strip and recommended monitoring. Patient has history of GDM, hypertension of pregnancy and advanced maternal age. Patient overall feels well. States she starting having some irregular contractions this morning (including when the baby had decel in clinic.) No vaginal bleeding, discharge or LOF. Good fetal movement. Patient's CBG range from 70's-90's. She states her blood pressures have been well controlled, but she does feel nervous right now.  OB History    Grav Para Term Preterm Abortions TAB SAB Ect Mult Living   2    1  1    0      Past Medical History  Diagnosis Date  . Hypertension   . Female circumcision   . Labial abscess   . AMA (advanced maternal age) multigravida 35+     Past Surgical History  Procedure Date  . No past surgeries     Family History  Problem Relation Age of Onset  . Hypertension Mother   . Diabetes Mother   . Hypertension Brother   . Hypertension Sister     History  Substance Use Topics  . Smoking status: Never Smoker   . Smokeless tobacco: Never Used  . Alcohol Use: No    Allergies: No Known Allergies  Prescriptions prior to admission  Medication Sig Dispense Refill  . calcium carbonate (TUMS EX) 750 MG chewable tablet Chew 1 tablet by mouth as needed.      . glyBURIDE (DIABETA) 2.5 MG tablet Take by mouth 2 (two) times daily. Take 2.5 mg qam, 3.75 mg qpm      . Prenatal Vit-Fe Psac Cmplx-FA (PRENATAL MULTIVITAMIN) 60-1 MG tablet Take 1 tablet by mouth daily.        Marland Kitchen ACCU-CHEK FASTCLIX LANCETS MISC 1 each by Does not apply route 4 (four) times daily. Check  blood sugar 4 times daily as instructed.  Dx code 648.03  102 each  6  . glucose blood test strip Accu-chek Smartview strips. Use 4 times daily.  Dx code 648.03  50 each  12    Review of Systems  Constitutional: Negative for fever and chills.  Respiratory: Negative for shortness of breath.   Cardiovascular: Negative for chest pain.  Gastrointestinal: Negative for nausea, vomiting and abdominal pain.  Genitourinary: Negative for dysuria.  Musculoskeletal: Negative for back pain.  Skin: Negative for rash.  Neurological: Negative for dizziness and headaches.   Physical Exam   Blood pressure 143/94, pulse 97, temperature 97.9 F (36.6 C), temperature source Oral, resp. rate 18, last menstrual period 10/27/2010.  Physical Exam  Constitutional: She is oriented to person, place, and time. She appears well-developed and well-nourished. No distress.  HENT:  Head: Normocephalic and atraumatic.  Neck: Normal range of motion.  Cardiovascular: Normal rate.   Respiratory: Effort normal.  GI: Soft. There is no tenderness.       Gravid. Toco in place.  Musculoskeletal: Normal range of motion. She exhibits no edema and no tenderness.  Neurological: She is alert and oriented to person, place, and time. No cranial nerve deficit.  Skin: Skin is dry. No rash noted.  Henna stains on soles and fingertips    MAU Course  Procedures  MDM Patient overall looks well. Will observe on monitor for at least one hour after NST. BP slightly elevated at triage. Recheck BP improved to 120's/80's.  Assessment and Plan  36 yo G2P0010 at [redacted]w[redacted]d presenting for NST and observation - Observed on monitor for two hours. Monitor strip evaluated by Drs. Shawnie Pons and [place]. No decels noted. Acels seen, moderate variability.  - BP stable. Patient has no questions. - Patient discharged home in stable medical condition. Will return to clinic on Monday for NST. Given labor precautions. Plan discussed with Dr.  Adrian Blackwater.  Ourania Hamler 07/19/2011, 4:07 PM

## 2011-07-19 NOTE — Progress Notes (Signed)
BP reviewed and with the resident and pt able to go home

## 2011-07-20 ENCOUNTER — Encounter: Payer: Self-pay | Admitting: *Deleted

## 2011-07-21 NOTE — MAU Provider Note (Signed)
Chart reviewed and agree with management and plan.  

## 2011-07-23 ENCOUNTER — Ambulatory Visit (INDEPENDENT_AMBULATORY_CARE_PROVIDER_SITE_OTHER): Payer: Medicaid Other | Admitting: Obstetrics and Gynecology

## 2011-07-23 VITALS — BP 132/90 | Temp 97.3°F | Wt 168.4 lb

## 2011-07-23 DIAGNOSIS — O99810 Abnormal glucose complicating pregnancy: Secondary | ICD-10-CM

## 2011-07-23 DIAGNOSIS — O09529 Supervision of elderly multigravida, unspecified trimester: Secondary | ICD-10-CM

## 2011-07-23 DIAGNOSIS — O169 Unspecified maternal hypertension, unspecified trimester: Secondary | ICD-10-CM

## 2011-07-23 LAB — POCT URINALYSIS DIP (DEVICE)
Bilirubin Urine: NEGATIVE
Ketones, ur: 15 mg/dL — AB
Leukocytes, UA: NEGATIVE
Specific Gravity, Urine: 1.025 (ref 1.005–1.030)
pH: 6 (ref 5.0–8.0)

## 2011-07-23 LAB — FETAL NONSTRESS TEST

## 2011-07-23 NOTE — Progress Notes (Signed)
C/o pelvic pressure/pain intermittently,

## 2011-07-23 NOTE — Progress Notes (Signed)
Pulse- 87 

## 2011-07-23 NOTE — Progress Notes (Signed)
NST reviewed and category I. Patient doing well without complaints. CBG all within range. Patient scheduled for induction of labor on Friday. FM/labor precautions reviewed

## 2011-07-27 ENCOUNTER — Inpatient Hospital Stay (HOSPITAL_COMMUNITY)
Admission: RE | Admit: 2011-07-27 | Discharge: 2011-07-30 | DRG: 774 | Disposition: A | Discharge status: Home / Self Care | Payer: Medicaid Other | Source: Ambulatory Visit | Attending: Obstetrics & Gynecology | Admitting: Obstetrics & Gynecology

## 2011-07-27 VITALS — BP 104/67 | HR 82 | Temp 98.1°F | Resp 18 | Ht 66.0 in | Wt 168.0 lb

## 2011-07-27 DIAGNOSIS — N90810 Female genital mutilation status, unspecified: Secondary | ICD-10-CM | POA: Diagnosis present

## 2011-07-27 DIAGNOSIS — O1002 Pre-existing essential hypertension complicating childbirth: Secondary | ICD-10-CM | POA: Diagnosis present

## 2011-07-27 DIAGNOSIS — O99892 Other specified diseases and conditions complicating childbirth: Secondary | ICD-10-CM | POA: Diagnosis present

## 2011-07-27 DIAGNOSIS — O09529 Supervision of elderly multigravida, unspecified trimester: Secondary | ICD-10-CM | POA: Diagnosis present

## 2011-07-27 DIAGNOSIS — O99814 Abnormal glucose complicating childbirth: Principal | ICD-10-CM | POA: Diagnosis present

## 2011-07-27 DIAGNOSIS — O99810 Abnormal glucose complicating pregnancy: Secondary | ICD-10-CM

## 2011-07-27 LAB — CBC
Hemoglobin: 12.6 g/dL (ref 12.0–15.0)
MCH: 27.1 pg (ref 26.0–34.0)
MCHC: 33.1 g/dL (ref 30.0–36.0)

## 2011-07-27 LAB — RPR: RPR Ser Ql: NONREACTIVE

## 2011-07-27 LAB — GLUCOSE, CAPILLARY
Glucose-Capillary: 104 mg/dL — ABNORMAL HIGH (ref 70–99)
Glucose-Capillary: 96 mg/dL (ref 70–99)
Glucose-Capillary: 138 mg/dL — ABNORMAL HIGH (ref 70–99)

## 2011-07-27 MED ORDER — OXYTOCIN 20 UNITS IN LACTATED RINGERS INFUSION - SIMPLE
1.0000 m[IU]/min | INTRAVENOUS | Status: DC
  Administered 2011-07-27 – 2011-07-28 (×2): 2 m[IU]/min via INTRAVENOUS

## 2011-07-27 MED ORDER — CITRIC ACID-SODIUM CITRATE 334-500 MG/5ML PO SOLN: 30.0000 mL | ORAL | Status: DC | PRN

## 2011-07-27 MED ORDER — OXYTOCIN BOLUS FROM INFUSION
500.0000 mL | Freq: Once | INTRAVENOUS | Status: DC
  Filled 2011-07-27: qty 1000
  Filled 2011-07-27: qty 500

## 2011-07-27 MED ORDER — TERBUTALINE SULFATE 1 MG/ML IJ SOLN: 0.2500 mg | Freq: Once | INTRAMUSCULAR | Status: AC | PRN

## 2011-07-27 MED ORDER — ACETAMINOPHEN 325 MG PO TABS
650.0000 mg | ORAL_TABLET | ORAL | Status: DC | PRN
  Administered 2011-07-28: 650 mg via ORAL
  Filled 2011-07-27: qty 2

## 2011-07-27 MED ORDER — INSULIN ASPART 100 UNIT/ML ~~LOC~~ SOLN: 0.0000 [IU] | Freq: Three times a day (TID) | SUBCUTANEOUS | Status: DC

## 2011-07-27 MED ORDER — ONDANSETRON HCL 4 MG/2ML IJ SOLN: 4.0000 mg | Freq: Four times a day (QID) | INTRAMUSCULAR | Status: DC | PRN

## 2011-07-27 MED ORDER — LACTATED RINGERS IV SOLN
INTRAVENOUS | Status: DC
  Administered 2011-07-27 – 2011-07-28 (×3): via INTRAVENOUS
  Administered 2011-07-28 (×2): 300 mL via INTRAVENOUS
  Administered 2011-07-28 (×2): via INTRAVENOUS

## 2011-07-27 MED ORDER — FLEET ENEMA 7-19 GM/118ML RE ENEM: 1.0000 | ENEMA | RECTAL | Status: DC | PRN

## 2011-07-27 MED ORDER — FENTANYL CITRATE 0.05 MG/ML IJ SOLN
100.0000 ug | INTRAMUSCULAR | Status: DC | PRN
  Administered 2011-07-27 – 2011-07-28 (×3): 100 ug via INTRAVENOUS
  Filled 2011-07-27 (×3): qty 2

## 2011-07-27 MED ORDER — LIDOCAINE HCL (PF) 1 % IJ SOLN
30.0000 mL | INTRAMUSCULAR | Status: DC | PRN
Start: 2011-07-27 — End: 2011-07-29
  Filled 2011-07-27: qty 30

## 2011-07-27 MED ORDER — IBUPROFEN 600 MG PO TABS: 600.0000 mg | ORAL_TABLET | Freq: Four times a day (QID) | ORAL | Status: DC | PRN

## 2011-07-27 MED ORDER — MISOPROSTOL 25 MCG QUARTER TABLET
25.0000 ug | ORAL_TABLET | ORAL | Status: DC | PRN
  Administered 2011-07-27 (×2): 25 ug via VAGINAL
  Filled 2011-07-27 (×2): qty 0.25

## 2011-07-27 MED ORDER — LACTATED RINGERS IV SOLN: 500.0000 mL | INTRAVENOUS | Status: DC | PRN

## 2011-07-27 MED ORDER — OXYTOCIN 20 UNITS IN LACTATED RINGERS INFUSION - SIMPLE
125.0000 mL/h | Freq: Once | INTRAVENOUS | Status: DC
  Filled 2011-07-27: qty 1000

## 2011-07-27 MED ORDER — OXYCODONE-ACETAMINOPHEN 5-325 MG PO TABS: 1.0000 | ORAL_TABLET | ORAL | Status: DC | PRN

## 2011-07-27 NOTE — Plan of Care (Signed)
Problem: Consults Goal: Birthing Suites Patient Information Press F2 to bring up selections list  Outcome: Completed/Met Date Met:  07/27/11  Pt 37-[redacted] weeks EGA and Inpatient induction  Comments:  39.0 wks, GDM

## 2011-07-27 NOTE — Progress Notes (Signed)
Debra Delgado is a 36 y.o. G2P0010 at [redacted]w[redacted]d by LMP admitted for induction of labor due to Gestational diabetes.  Subjective:   Objective: BP 136/89  Pulse 96  Temp(Src) 98.4 F (36.9 C) (Oral)  Resp 20  Ht 5\' 6"  (1.676 m)  Wt 76.204 kg (168 lb)  BMI 27.12 kg/m2  LMP 10/27/2010      FHT:  FHR: 140 bpm, variability: moderate,  accelerations:  Present,  decelerations:  Absent UC:   irregular, every 3 minutes SVE:   Dilation: 1.5 Effacement (%): 50 Station: -2 Exam by:: Dr Debra Delgado  Labs: Lab Results  Component Value Date   WBC 6.9 07/27/2011   HGB 12.6 07/27/2011   HCT 38.1 07/27/2011   MCV 81.9 07/27/2011   PLT 236 07/27/2011    Assessment / Plan: 36 y.o. G2P0010 at [redacted]w[redacted]d,induction of labor due to Gestational diabetes.  Labor: Cervical ripening stage, foley bulb placed, second dose of cytotec placed in posterior cul de sac. GDM:q4 CBG checks, correction for sugars above 120 Preeclampsia: No signs or symptoms Fetal Wellbeing:  Category I Pain Control:  plan on epidural when cervical dilitation increases, IV fentanyl prn for now I/D:  n/a Anticipated MOD:  NSVD  Debra Delgado 07/27/2011, 1:29 PM

## 2011-07-27 NOTE — H&P (Signed)
Debra Delgado is a 36 y.o. female presenting for Induction of labor due to gestational diabetes.  She says she is feeling fine, reports good fetal movement, denies contractions, leakage of fluid, vaginal bleeding.  She has had well controlled gestational diabetes, taking glyburide 2.5 po QHS.  She has also had some elevated blood pressures in clinic during this pregnancy, but has not ben on antihypertensive medications.   History OB History    Grav Para Term Preterm Abortions TAB SAB Ect Mult Living   2    1  1    0     Past Medical History  Diagnosis Date  . Hypertension   . Female circumcision   . Labial abscess   . AMA (advanced maternal age) multigravida 35+    Past Surgical History  Procedure Date  . No past surgeries    Family History: family history includes Diabetes in her mother and Hypertension in her brother, mother, and sister. Social History:  reports that she has never smoked. She has never used smokeless tobacco. She reports that she does not drink alcohol or use illicit drugs.  ROS  Dilation: 1 Effacement (%): 50 Station: -2 Exam by:: Dr Debra Delgado Blood pressure 136/89, pulse 96, temperature 98.4 F (36.9 C), temperature source Oral, resp. rate 20, height 5\' 6"  (1.676 m), weight 76.204 kg (168 lb), last menstrual period 10/27/2010. Exam Physical Exam  Constitutional: She is oriented to person, place, and time. She appears well-developed and well-nourished. No distress.  HENT:  Head: Normocephalic and atraumatic.  Eyes: EOM are normal. Pupils are equal, round, and reactive to light.  Neck: Normal range of motion.  Cardiovascular: Normal rate and regular rhythm.   Respiratory: Effort normal and breath sounds normal.  GI:       Gravid, non-tender.   Genitourinary:       Female circumcision.   Neurological: She is alert and oriented to person, place, and time.    Prenatal labs: ABO, Rh: O/Positive/-- (10/24 0000) Antibody: Negative (10/24 0000) Rubella:  Immune (10/24 0000) RPR: NON REAC (01/10 1026)  HBsAg: Negative (10/24 0000)  HIV: NON REACTIVE (01/10 1026)  GBS:   Negative  Assessment/Plan: 36 year old G2P0 at 70 weeks, here for Induction of labor due to gestational diabetes - unfavorable cervix, will place cytotec for cervical ripening - will do intermittent fetal monitoring, allow her to ambulate - LR @125  cc/s hour - intermittent CBG and BP monitoring.   Debra Delgado 07/27/2011, 8:57 AM

## 2011-07-27 NOTE — Progress Notes (Signed)
Patient ID: Debra Delgado, female   DOB: [birth year], 36 y.o.   MRN: [id number] Debra Delgado is a 36 y.o. G2P0010 at [redacted]w[redacted]d admitted for IOL indicated by [facility] and A2GDM  Subjective: Comfortable.  Objective: BP 134/85  Pulse 94  Temp(Src) 97.9 F (36.6 C) (Oral)  Resp 20  Ht 5\' 6"  (1.676 m)  Wt 76.204 kg (168 lb)  BMI 27.12 kg/m2  LMP 10/27/2010  CBG 138 at 2110 1.5 hr after eating, prior was 96 Fetal Heart FHR: 145 bpm, variability: moderate,  accelerations:  Present,  decelerations:  Absent   Contractions:q 2-4 min,  Pit 6 mu  SVE:   Dilation: 4.5 Effacement (%): 70 Station: -2 Exam by:: Danella Deis, RNC-OB, C-EFM  Assessment / Plan: Labor: early, s/p foley bulb -> continue titrating pitocin up to get regular UCs Fetal Wellbeing: Cat1 Pain Control:  adequate Expected mode of delivery: NSVD BPs mildly elevated One postprandial CBG mildly elevated -> recheck 3 hrs   Debra Delgado 07/27/2011, 9:38 PM

## 2011-07-27 NOTE — Progress Notes (Signed)
Patient ID: Debra Delgado, female   DOB: [birth year], 36 y.o.   MRN: [id number] Debra Delgado is a 36 y.o. G2P0010 at [redacted]w[redacted]d by LMP admitted for induction of labor due to Gestational diabetes.  Subjective: Patient feeling some contractions, had some bloody show.   Objective: BP 129/87  Pulse 93  Temp(Src) 97.9 F (36.6 C) (Oral)  Resp 20  Ht 5\' 6"  (1.676 m)  Wt 76.204 kg (168 lb)  BMI 27.12 kg/m2  LMP 10/27/2010      FHT:  FHR: 140 bpm, variability: moderate,  accelerations:  Present,  decelerations:  Absent UC:   irregular, every 3 minutes Foley bulb in place.  SVE:   Dilation: 1.5 Effacement (%): 50 Station: -2 Exam by:: Dr Erlene Senters  Labs: Lab Results  Component Value Date   WBC 6.9 07/27/2011   HGB 12.6 07/27/2011   HCT 38.1 07/27/2011   MCV 81.9 07/27/2011   PLT 236 07/27/2011    Assessment / Plan: 36 y.o. G2P0010 at [redacted]w[redacted]d,induction of labor due to Gestational diabetes.  Labor: Cervical ripening stage, foley bulb placed, second dose of cytotec placed in posterior cul de sac. GDM:q4 CBG checks, correction for sugars above 120 Preeclampsia: No signs or symptoms Fetal Wellbeing:  Category I Pain Control:  plan on epidural when cervical dilitation increases, IV fentanyl prn for now I/D:  n/a Anticipated MOD:  NSVD  Debra Delgado 07/27/2011, 4:25 PM

## 2011-07-28 ENCOUNTER — Inpatient Hospital Stay (HOSPITAL_COMMUNITY): Payer: Medicaid Other | Admitting: Anesthesiology

## 2011-07-28 ENCOUNTER — Encounter (HOSPITAL_COMMUNITY): Payer: Self-pay | Admitting: Anesthesiology

## 2011-07-28 LAB — GLUCOSE, CAPILLARY
Glucose-Capillary: 81 mg/dL (ref 70–99)
Glucose-Capillary: 92 mg/dL (ref 70–99)
Glucose-Capillary: 82 mg/dL (ref 70–99)

## 2011-07-28 MED ORDER — FENTANYL 2.5 MCG/ML BUPIVACAINE 1/10 % EPIDURAL INFUSION (WH - ANES)
INTRAMUSCULAR | Status: DC | PRN
  Administered 2011-07-28: 14 mL/h via EPIDURAL

## 2011-07-28 MED ORDER — EPHEDRINE 5 MG/ML INJ: 10.0000 mg | INTRAVENOUS | Status: DC | PRN

## 2011-07-28 MED ORDER — PHENYLEPHRINE 40 MCG/ML (10ML) SYRINGE FOR IV PUSH (FOR BLOOD PRESSURE SUPPORT): 80.0000 ug | PREFILLED_SYRINGE | INTRAVENOUS | Status: DC | PRN

## 2011-07-28 MED ORDER — DIPHENHYDRAMINE HCL 50 MG/ML IJ SOLN: 12.5000 mg | INTRAMUSCULAR | Status: DC | PRN

## 2011-07-28 MED ORDER — MISOPROSTOL 200 MCG PO TABS: 1000.0000 ug | ORAL_TABLET | Freq: Once | ORAL | Status: DC

## 2011-07-28 MED ORDER — FENTANYL 2.5 MCG/ML BUPIVACAINE 1/10 % EPIDURAL INFUSION (WH - ANES)
14.0000 mL/h | INTRAMUSCULAR | Status: DC
  Administered 2011-07-28 (×3): 14 mL/h via EPIDURAL
  Filled 2011-07-28 (×4): qty 60

## 2011-07-28 MED ORDER — PHENYLEPHRINE 40 MCG/ML (10ML) SYRINGE FOR IV PUSH (FOR BLOOD PRESSURE SUPPORT)
PREFILLED_SYRINGE | INTRAVENOUS | Status: AC
  Filled 2011-07-28: qty 5

## 2011-07-28 MED ORDER — EPHEDRINE 5 MG/ML INJ
INTRAVENOUS | Status: AC
  Filled 2011-07-28: qty 4

## 2011-07-28 MED ORDER — LIDOCAINE HCL (PF) 1 % IJ SOLN
INTRAMUSCULAR | Status: DC | PRN
  Administered 2011-07-28: 8 mL
  Administered 2011-07-28: 30 mL
  Administered 2011-07-28: 7 mL

## 2011-07-28 MED ORDER — FENTANYL CITRATE 0.05 MG/ML IJ SOLN: 100.0000 ug | Freq: Once | INTRAMUSCULAR | Status: DC

## 2011-07-28 MED ORDER — FENTANYL 2.5 MCG/ML BUPIVACAINE 1/10 % EPIDURAL INFUSION (WH - ANES)
INTRAMUSCULAR | Status: AC
  Filled 2011-07-28: qty 60

## 2011-07-28 MED ORDER — MISOPROSTOL 200 MCG PO TABS
ORAL_TABLET | ORAL | Status: AC
  Filled 2011-07-28: qty 5

## 2011-07-28 MED ORDER — LACTATED RINGERS IV SOLN: 500.0000 mL | Freq: Once | INTRAVENOUS | Status: DC

## 2011-07-28 NOTE — Progress Notes (Signed)
Patient ID: Debra Delgado, female   DOB: 1975-08-24, 36 y.o.   MRN: [id number] Debra Delgado is a 36 y.o. G2P0010 at [redacted]w[redacted]d admitted for IOL indicated by CHTN/GDM  Subjective: Requests pain med. Objective: BP 123/75  Pulse 103  Temp(Src) 97.8 F (36.6 C) (Oral)  Resp 20  Ht 5\' 6"  (1.676 m)  Wt 76.204 kg (168 lb)  BMI 27.12 kg/m2  LMP 10/27/2010  Fetal Heart FHR: 140 bpm, variability: moderate,  accelerations:  Present,  decelerations:  Absent   Contractions: irreg q 1-4, pit @ 14 mu  SVE:   Dilation: 4.5 Effacement (%): 70 Station: -2 Exam by:: Danella Deis, RNC-OB, C-EFM VE: post, 5/80/-2, membranes stripped  Assessment / Plan: Labor: Early,  Pitocin stimulated s/p FB Fetal Wellbeing: Cat 1 Pain Control:  Fentanyl Expected mode of delivery: NSVD  [name] 07/28/2011, 2:52 AM

## 2011-07-28 NOTE — Progress Notes (Signed)
Debra Delgado is a 36 y.o. G2P0010 at [redacted]w[redacted]d  Subjective: Patient doing well. No complaints   Objective: BP 148/85  Pulse 87  Temp(Src) 98.6 F (37 C) (Oral)  Resp 20  Ht 5\' 6"  (1.676 m)  Wt 76.204 kg (168 lb)  BMI 27.12 kg/m2  LMP 10/27/2010     FHT:  FHR: 140 bpm, variability: moderate,  accelerations:  Present,  decelerations:  Absent UC:   irregular, every 3-6 minutes, difficult to monitor SVE:   Dilation: 5.5 Effacement (%): 80 Station: -2 Exam by:: Clearence Cheek RN  Labs: Lab Results  Component Value Date   WBC 6.9 07/27/2011   HGB 12.6 07/27/2011   HCT 38.1 07/27/2011   MCV 81.9 07/27/2011   PLT 236 07/27/2011    Assessment / Plan: Induction of labor due to gestational diabetes and hyptertension. AROM and placement on IUPC at 0905.  Labor: Progressing normally Preeclampsia:  labs stable Fetal Wellbeing:  Category I Pain Control:  Epidural when patient desires I/D:  n/a Anticipated MOD:  NSVD  Rupert Azzara 07/28/2011, 9:12 AM

## 2011-07-28 NOTE — Anesthesia Preprocedure Evaluation (Signed)
Anesthesia Evaluation  Patient identified by MRN, date of birth, ID band Patient awake    Reviewed: Allergy & Precautions, H&P , NPO status , Patient's Chart, lab work & pertinent test results  Airway Mallampati: I TM Distance: >3 FB Neck ROM: full    Dental No notable dental hx.    Pulmonary neg pulmonary ROS,  breath sounds clear to auscultation  Pulmonary exam normal       Cardiovascular     Neuro/Psych negative neurological ROS  negative psych ROS   GI/Hepatic negative GI ROS, Neg liver ROS,   Endo/Other  Diabetes mellitus-, Gestational  Renal/GU negative Renal ROS  negative genitourinary   Musculoskeletal negative musculoskeletal ROS (+)   Abdominal Normal abdominal exam  (+)   Peds negative pediatric ROS (+)  Hematology negative hematology ROS (+)   Anesthesia Other Findings   Reproductive/Obstetrics (+) Pregnancy                           Anesthesia Physical Anesthesia Plan  ASA: II  Anesthesia Plan: Epidural   Post-op Pain Management:    Induction:   Airway Management Planned:   Additional Equipment:   Intra-op Plan:   Post-operative Plan:   Informed Consent: I have reviewed the patients History and Physical, chart, labs and discussed the procedure including the risks, benefits and alternatives for the proposed anesthesia with the patient or authorized representative who has indicated his/her understanding and acceptance.     Plan Discussed with:   Anesthesia Plan Comments:         Anesthesia Quick Evaluation

## 2011-07-28 NOTE — Anesthesia Procedure Notes (Signed)
Epidural Patient location during procedure: OB Start time: 07/28/2011 9:59 AM End time: 07/28/2011 10:05 AM Reason for block: procedure for pain  Staffing Anesthesiologist: Sandrea Hughs Performed by: anesthesiologist   Preanesthetic Checklist Completed: patient identified, site marked, surgical consent, pre-op evaluation, timeout performed, IV checked, risks and benefits discussed and monitors and equipment checked  Epidural Patient position: sitting Prep: site prepped and draped and DuraPrep Patient monitoring: continuous pulse ox and blood pressure Approach: midline Injection technique: LOR air  Needle:  Needle type: Tuohy  Needle gauge: 17 G Needle length: 9 cm Needle insertion depth: 6 cm Catheter type: closed end flexible Catheter size: 19 Gauge Catheter at skin depth: 11 cm Test dose: negative and Other  Assessment Sensory level: T8 Events: blood not aspirated, injection not painful, no injection resistance, negative IV test and no paresthesia

## 2011-07-28 NOTE — Progress Notes (Signed)
Debra Delgado is a 36 y.o. G2P0010 at [redacted]w[redacted]d  Subjective: Patient resting comfortably on left side. Currently on a break from pitocin due to prolonged inadequate contractions and irregular pattern.   Objective: BP 113/61  Pulse 91  Temp(Src) 98.4 F (36.9 C) (Oral)  Resp 20  Ht 5\' 6"  (1.676 m)  Wt 76.204 kg (168 lb)  BMI 27.12 kg/m2  SpO2 100%  LMP 10/27/2010     FHT:  FHR: 140 bpm, variability: moderate,  accelerations:  Present,  decelerations:  Absent UC:   regular, every 5-6 minutes (inadequate MVU) SVE:   Dilation: 7 Effacement (%): 90 Station: -1;0 Exam by:: Clearence Cheek RN  Labs: Lab Results  Component Value Date   WBC 6.9 07/27/2011   HGB 12.6 07/27/2011   HCT 38.1 07/27/2011   MCV 81.9 07/27/2011   PLT 236 07/27/2011    Assessment / Plan: Induction of labor due to gestational hypertension and gestational diabetes, progressing slowly. After a 2 hour break of Pitocin, will restart a fresh bag in 5-10 minutes and continue to monitor contractions.  Labor: Progressing normally Preeclampsia:  labs stable Fetal Wellbeing:  Category I Pain Control:  Epidural I/D:  n/a Anticipated MOD:  NSVD  Symir Mah 07/28/2011, 5:03 PM

## 2011-07-28 NOTE — Progress Notes (Signed)
Debra Delgado is a 36 y.o. G2P0010 at [redacted]w[redacted]d.   Subjective: Comfortable w/ epidural. Pushing voluntarily x ~20 minutes.  Objective: BP 170/85  Pulse 106  Temp(Src) 98.5 F (36.9 C) (Oral)  Resp 18  Ht 5\' 6"  (1.676 m)  Wt 76.204 kg (168 lb)  BMI 27.12 kg/m2  SpO2 100%  LMP 10/27/2010 07/28/11 2032 -- ! 106 -- 18 ! 170/85 mmHg -- -- -- HN  07/28/11 2002 -- ! 104 -- 20 124/73 mmHg -- -- -- HN  07/28/11 1932 98.5 F (36.9 C) 98 -- 18 128/78 mmHg -- -- -- HN 07/28/11 1903 -- ! 101 -- -- 119/72 mmHg -- -- -- CP  07/28/11 1902 -- ! 104 -- -- 127/67 mmHg -- -- -- CP  07/28/11 1901 -- -- -- 20 119/72 mmHg -- -- -- CP  07/28/11 1900 -- ! 104 -- -- 127/67 mmHg -- -- -- CP  07/28/11 1832 -- ! 104 -- 20 124/80 mmHg -- -- -- CP  07/28/11 1818 -- 98 -- 20 125/84 mmHg -- -- -- CP  07/28/11 1802 98.3 F (36.8 C) ! 106 -- 20 126/85 mmHg -- -- -- CP  07/28/11 1744 -- ! 106 -- 20 ! 124/99 mmHg -- -- -- CP  07/28/11 1732 -- ! 106 -- 20 128/81 mmHg -- -- -- CP  07/28/11 1712 -- 97 -- 20 124/75 mmHg -- -- -- CP   FHT:  FHR: 150 bpm, variability: minimal ,  accelerations:  Present,  decelerations:  Absent UC:   regular, every 2-4 minutes, IUPC not tracing well, especially w/ pushing. Moderate to palpation. SVE:   Dilation: 10 Effacement (%): 100 Station: +2 Exam by: Debra Delgado, CNM  Labs:  Assessment / Plan: Induction of labor due to gestational diabetes,  Beginning second stage. Pushing well.  Labor: Protracted labor. Pushing well. Preeclampsia:  NA Fetal Wellbeing:  Category II Pain Control:  Epidural Anticipated MOD:  NSVD  Debra Delgado 07/28/2011, 8:52 PM

## 2011-07-28 NOTE — Progress Notes (Signed)
Debra Delgado is a 36 y.o. G2P0010 at [redacted]w[redacted]d  Subjective: Patient sleeping comfortably on her side. S/p epidural  Objective: BP 117/68  Pulse 87  Temp(Src) 98.8 F (37.1 C) (Oral)  Resp 20  Ht 5\' 6"  (1.676 m)  Wt 76.204 kg (168 lb)  BMI 27.12 kg/m2  SpO2 100%  LMP 10/27/2010     FHT:  FHR: 140 bpm, variability: moderate,  accelerations:  Present,  decelerations:  Absent UC:   regular, every 3-5 minutes at this time.  SVE:   Dilation: 6 Effacement (%): 80 Station: -2 Exam by:: Clearence Cheek RN  Labs: Lab Results  Component Value Date   WBC 6.9 07/27/2011   HGB 12.6 07/27/2011   HCT 38.1 07/27/2011   MCV 81.9 07/27/2011   PLT 236 07/27/2011    Assessment / Plan: Induction of labor due to gestational hypertension and gestational diabetes,  progressing well on pitocin  Labor: Progressing normally on Pitocin. (s/p AROM) Preeclampsia:  labs stable Fetal Wellbeing:  Category I Pain Control:  Epidural I/D:  N/A Anticipated MOD:  NSVD  Debra Delgado 07/28/2011, 1:29 PM

## 2011-07-29 ENCOUNTER — Encounter (HOSPITAL_COMMUNITY): Payer: Self-pay

## 2011-07-29 DIAGNOSIS — O09529 Supervision of elderly multigravida, unspecified trimester: Secondary | ICD-10-CM

## 2011-07-29 DIAGNOSIS — O1002 Pre-existing essential hypertension complicating childbirth: Secondary | ICD-10-CM

## 2011-07-29 DIAGNOSIS — O99814 Abnormal glucose complicating childbirth: Secondary | ICD-10-CM

## 2011-07-29 LAB — CBC
HCT: 32.3 % — ABNORMAL LOW (ref 36.0–46.0)
Hemoglobin: 10.4 g/dL — ABNORMAL LOW (ref 12.0–15.0)
RBC: 3.92 MIL/uL (ref 3.87–5.11)
WBC: 12.7 10*3/uL — ABNORMAL HIGH (ref 4.0–10.5)

## 2011-07-29 LAB — ABO/RH: ABO/RH(D): O POS

## 2011-07-29 MED ORDER — IBUPROFEN 600 MG PO TABS
600.0000 mg | ORAL_TABLET | Freq: Four times a day (QID) | ORAL | Status: DC
  Administered 2011-07-29 – 2011-07-30 (×5): 600 mg via ORAL
  Filled 2011-07-29: qty 1
  Filled 2011-07-29: qty 2
  Filled 2011-07-29 (×4): qty 1

## 2011-07-29 MED ORDER — MAGNESIUM HYDROXIDE 400 MG/5ML PO SUSP: 30.0000 mL | ORAL | Status: DC | PRN

## 2011-07-29 MED ORDER — DIBUCAINE 1 % RE OINT: 1.0000 "application " | TOPICAL_OINTMENT | RECTAL | Status: DC | PRN

## 2011-07-29 MED ORDER — ONDANSETRON HCL 4 MG PO TABS: 4.0000 mg | ORAL_TABLET | ORAL | Status: DC | PRN

## 2011-07-29 MED ORDER — ZOLPIDEM TARTRATE 5 MG PO TABS: 5.0000 mg | ORAL_TABLET | Freq: Every evening | ORAL | Status: DC | PRN

## 2011-07-29 MED ORDER — DIPHENHYDRAMINE HCL 25 MG PO CAPS: 25.0000 mg | ORAL_CAPSULE | Freq: Four times a day (QID) | ORAL | Status: DC | PRN

## 2011-07-29 MED ORDER — SENNOSIDES-DOCUSATE SODIUM 8.6-50 MG PO TABS
2.0000 | ORAL_TABLET | Freq: Every day | ORAL | Status: DC
  Administered 2011-07-29: 2 via ORAL

## 2011-07-29 MED ORDER — METHYLERGONOVINE MALEATE 0.2 MG/ML IJ SOLN: 0.2000 mg | INTRAMUSCULAR | Status: DC | PRN

## 2011-07-29 MED ORDER — MEASLES, MUMPS & RUBELLA VAC ~~LOC~~ INJ
0.5000 mL | INJECTION | Freq: Once | SUBCUTANEOUS | Status: DC
  Filled 2011-07-29: qty 0.5

## 2011-07-29 MED ORDER — TETANUS-DIPHTH-ACELL PERTUSSIS 5-2.5-18.5 LF-MCG/0.5 IM SUSP
0.5000 mL | Freq: Once | INTRAMUSCULAR | Status: AC
  Administered 2011-07-30: 0.5 mL via INTRAMUSCULAR

## 2011-07-29 MED ORDER — METHYLERGONOVINE MALEATE 0.2 MG PO TABS: 0.2000 mg | ORAL_TABLET | ORAL | Status: DC | PRN

## 2011-07-29 MED ORDER — OXYCODONE-ACETAMINOPHEN 5-325 MG PO TABS
1.0000 | ORAL_TABLET | ORAL | Status: DC | PRN
  Administered 2011-07-29 (×2): 1 via ORAL
  Filled 2011-07-29 (×2): qty 1

## 2011-07-29 MED ORDER — BENZOCAINE-MENTHOL 20-0.5 % EX AERO: 1.0000 "application " | INHALATION_SPRAY | CUTANEOUS | Status: DC | PRN

## 2011-07-29 MED ORDER — PRENATAL MULTIVITAMIN CH
1.0000 | ORAL_TABLET | Freq: Every day | ORAL | Status: DC
  Administered 2011-07-29 – 2011-07-30 (×2): 1 via ORAL
  Filled 2011-07-29 (×2): qty 1

## 2011-07-29 MED ORDER — SIMETHICONE 80 MG PO CHEW: 80.0000 mg | CHEWABLE_TABLET | ORAL | Status: DC | PRN

## 2011-07-29 MED ORDER — LANOLIN HYDROUS EX OINT: 1.0000 "application " | TOPICAL_OINTMENT | CUTANEOUS | Status: DC | PRN

## 2011-07-29 MED ORDER — ONDANSETRON HCL 4 MG/2ML IJ SOLN: 4.0000 mg | INTRAMUSCULAR | Status: DC | PRN

## 2011-07-29 MED ORDER — WITCH HAZEL-GLYCERIN EX PADS: 1.0000 "application " | MEDICATED_PAD | CUTANEOUS | Status: DC | PRN

## 2011-07-29 MED ORDER — FERROUS SULFATE 325 (65 FE) MG PO TABS
325.0000 mg | ORAL_TABLET | Freq: Two times a day (BID) | ORAL | Status: DC
  Administered 2011-07-29 – 2011-07-30 (×3): 325 mg via ORAL
  Filled 2011-07-29 (×3): qty 1

## 2011-07-29 NOTE — Anesthesia Postprocedure Evaluation (Signed)
Anesthesia Post Note  Patient: Debra Delgado  Procedure(s) Performed: * No procedures listed *  Anesthesia type: Epidural  Patient location: Mother/Baby  Post pain: Pain level controlled  Post assessment: Post-op Vital signs reviewed  Last Vitals:  Filed Vitals:   07/29/11 0330  BP: 125/83  Pulse: 110  Temp: 37.1 C  Resp: 20    Post vital signs: Reviewed  Level of consciousness: awake  Complications: No apparent anesthesia complications

## 2011-07-29 NOTE — Progress Notes (Signed)
Post Partum Day #1 Subjective: no complaints, up ad lib, voiding and tolerating PO Breastfeeding well. Would like lactation consult today. No complaints or concerns.  Objective: Blood pressure 125/83, pulse 110, temperature 98.8 F (37.1 C), temperature source Oral, resp. rate 20, height 5\' 6"  (1.676 m), weight 76.204 kg (168 lb), last menstrual period 10/27/2010, SpO2 98.00%, unknown if currently breastfeeding.  Physical Exam:  General: alert, cooperative and no distress Lochia: appropriate Uterine Fundus: firm Incision: N/A DVT Evaluation: No evidence of DVT seen on physical exam.   Basename 07/29/11 0500 07/27/11 0721  HGB 10.4* 12.6  HCT 32.3* 38.1    Assessment/Plan: Plan for discharge tomorrow, Breastfeeding, Lactation consult Pain well controlled at this time. Encouraged patient to ambulate as much as possible.   LOS: 2 days   Kameah Rawl 07/29/2011, 7:16 AM

## 2011-07-29 NOTE — Addendum Note (Signed)
Addendum  created 07/29/11 0913 by Jhonnie Garner, CRNA   Modules edited:Charges VN, Notes Section

## 2011-07-29 NOTE — Anesthesia Postprocedure Evaluation (Signed)
Anesthesia Post Note  Patient: Debra Delgado  Procedure(s) Performed: * No procedures listed *  Anesthesia type: Epidural  Patient location: Mother/Baby  Post pain: Pain level controlled  Post assessment: Post-op Vital signs reviewed  Last Vitals:  Filed Vitals:   07/29/11 0330  BP: 125/83  Pulse: 110  Temp: 37.1 C  Resp: 20    Post vital signs: Reviewed  Level of consciousness: awake  Complications: No apparent anesthesia complications 

## 2011-07-30 MED ORDER — FERROUS SULFATE 325 (65 FE) MG PO TABS: 325.0000 mg | ORAL_TABLET | Freq: Two times a day (BID) | ORAL | Status: DC

## 2011-07-30 MED ORDER — IBUPROFEN 600 MG PO TABS: 600.0000 mg | ORAL_TABLET | Freq: Four times a day (QID) | ORAL | Status: AC

## 2011-07-30 MED ORDER — OXYCODONE-ACETAMINOPHEN 5-325 MG PO TABS: 1.0000 | ORAL_TABLET | ORAL | Status: AC | PRN

## 2011-07-30 NOTE — Progress Notes (Signed)
UR chart review completed.  

## 2011-07-30 NOTE — Discharge Summary (Signed)
Obstetric Discharge Summary Reason for Admission: onset of labor Prenatal Procedures: ultrasound Intrapartum Procedures: spontaneous vaginal delivery Postpartum Procedures: none Complications-Operative and Postpartum: none Hemoglobin  Date Value Range Status  07/29/2011 10.4* 12.0-15.0 (g/dL) Final     REPEATED TO VERIFY     DELTA CHECK NOTED     HCT  Date Value Range Status  07/29/2011 32.3* 36.0-46.0 (%) Final    Physical Exam:  General: alert, cooperative, appears stated age and no distress Lochia: appropriate Uterine Fundus: firm Incision: repair intact DVT Evaluation: No evidence of DVT seen on physical exam. Negative Homan's sign. No cords or calf tenderness. No significant calf/ankle edema.  Discharge Diagnoses: Term Pregnancy-delivered  Discharge Information: Date: 07/30/2011 Activity: pelvic rest Diet: routine Medications: PNV, Ibuprofen and Percocet Condition: stable and improved Instructions: refer to practice specific booklet Discharge to: home   Newborn Data: Live born female  Birth Weight: 8 lb 5.9 oz (3795 g) APGAR: 7, 9  Home with mother.  Debra Delgado Debra Delgado 07/30/2011, 7:26 AM

## 2011-07-30 NOTE — Progress Notes (Signed)
Post Partum Day 2 Subjective: no complaints, up ad lib, voiding, tolerating PO and + flatus  Objective: Blood pressure 104/67, pulse 82, temperature 98.1 F (36.7 C), temperature source Oral, resp. rate 18, height 5\' 6"  (1.676 m), weight 168 lb (76.204 kg), last menstrual period 10/27/2010, SpO2 98.00%, unknown if currently breastfeeding.  Physical Exam:  General: alert, cooperative, appears stated age and no distress Lochia: appropriate Uterine Fundus: firm Incision: repair intact, no redness swelling or drainage. DVT Evaluation: No evidence of DVT seen on physical exam. Negative Homan's sign. No cords or calf tenderness. No significant calf/ankle edema.   Basename 07/29/11 0500  HGB 10.4*  HCT 32.3*    Assessment/Plan: Discharge home contraception implanon   LOS: 3 days   Fallou Hulbert DARLENE 07/30/2011, 7:24 AM

## 2011-08-01 LAB — BODY FLUID CULTURE: Special Requests: NORMAL

## 2011-08-03 MED ORDER — HYDROCHLOROTHIAZIDE 12.5 MG PO TABS: 25.0000 mg | ORAL_TABLET | Freq: Every day | ORAL | Status: DC

## 2011-08-05 LAB — ANAEROBIC CULTURE: Special Requests: NORMAL

## 2011-08-22 ENCOUNTER — Telehealth: Payer: Self-pay

## 2011-08-22 NOTE — Telephone Encounter (Signed)
Replied to Dr. Penne Lash- pt has pp appt scheduled for 08/29/11 @ 1245pm is that appt acceptable or does she need an earlier appt. Awaiting response from Dr.  Penne Lash.

## 2011-08-22 NOTE — Telephone Encounter (Signed)
Message copied by Faythe Casa on Wed Aug 22, 2011  9:20 AM ------      Message from: Lesly Dukes      Created: Sun Aug 12, 2011  2:21 PM       This patient had a peri urethral mass drained.  This could be a urethral diverticulum and needs to seen and make sure there is no infection.  Please make appt. In the clinic or refer to the office she came from if [place] or [place].

## 2011-08-29 ENCOUNTER — Ambulatory Visit (INDEPENDENT_AMBULATORY_CARE_PROVIDER_SITE_OTHER): Payer: Medicaid Other | Admitting: Physician Assistant

## 2011-08-29 ENCOUNTER — Encounter: Payer: Self-pay | Admitting: Physician Assistant

## 2011-08-29 VITALS — BP 119/87 | HR 83 | Temp 98.5°F | Ht 66.0 in | Wt 146.4 lb

## 2011-08-29 DIAGNOSIS — Z23 Encounter for immunization: Secondary | ICD-10-CM

## 2011-08-29 DIAGNOSIS — O09899 Supervision of other high risk pregnancies, unspecified trimester: Secondary | ICD-10-CM

## 2011-08-29 DIAGNOSIS — Z8632 Personal history of gestational diabetes: Secondary | ICD-10-CM | POA: Insufficient documentation

## 2011-08-29 LAB — POCT PREGNANCY, URINE: Preg Test, Ur: NEGATIVE

## 2011-08-29 MED ORDER — VARICELLA VIRUS VACCINE LIVE 1350 PFU/0.5ML IJ SUSR
0.5000 mL | Freq: Once | INTRAMUSCULAR | Status: AC
  Administered 2011-08-29: 0.5 mL via SUBCUTANEOUS

## 2011-08-29 NOTE — Progress Notes (Unsigned)
Patient states she was told to take medication for her high blood pressure. Pharmacy needed clarification on order- patient never received medication

## 2011-08-29 NOTE — Patient Instructions (Signed)
iud Intrauterine Device Information An intrauterine device (IUD) is inserted into your uterus and prevents pregnancy. There are 2 types of IUDs available:  Copper IUD. This type of IUD is wrapped in copper wire and is placed inside the uterus. Copper makes the uterus and fallopian tubes produce a fluid that kills sperm. The copper IUD can stay in place for 10 years.   Hormone IUD. This type of IUD contains the hormone progestin (synthetic progesterone). The hormone thickens the cervical mucus and prevents sperm from entering the uterus, and it also thins the uterine lining to prevent implantation of a fertilized egg. The hormone can weaken or kill the sperm that get into the uterus. The hormone IUD can stay in place for 5 years.  Your caregiver will make sure you are a good candidate for a contraceptive IUD. Discuss with your caregiver the possible side effects. ADVANTAGES  It is highly effective, reversible, long-acting, and low maintenance.   There are no estrogen-related side effects.   An IUD can be used when breastfeeding.   It is not associated with weight gain.   It works immediately after insertion.   The copper IUD does not interfere with your female hormones.   The progesterone IUD can make heavy menstrual periods lighter.   The progesterone IUD can be used for 5 years.   The copper IUD can be used for 10 years.  DISADVANTAGES  The progesterone IUD can be associated with irregular bleeding patterns.   The copper IUD can make your menstrual flow heavier and more painful.   You may experience cramping and vaginal bleeding after insertion.  Document Released: 03/20/2004 Document Revised: 04/05/2011 Document Reviewed: 08/19/2010 [facility] Patient Information 2012 [place], [place].

## 2011-09-14 ENCOUNTER — Encounter: Payer: Self-pay | Admitting: Advanced Practice Midwife

## 2011-09-14 ENCOUNTER — Ambulatory Visit (INDEPENDENT_AMBULATORY_CARE_PROVIDER_SITE_OTHER): Payer: Medicaid Other | Admitting: Advanced Practice Midwife

## 2011-09-14 VITALS — BP 129/88 | HR 92 | Temp 97.6°F | Ht 66.0 in | Wt 147.5 lb

## 2011-09-14 DIAGNOSIS — Z01812 Encounter for preprocedural laboratory examination: Secondary | ICD-10-CM

## 2011-09-14 DIAGNOSIS — Z30017 Encounter for initial prescription of implantable subdermal contraceptive: Secondary | ICD-10-CM | POA: Insufficient documentation

## 2011-09-14 DIAGNOSIS — Z3049 Encounter for surveillance of other contraceptives: Secondary | ICD-10-CM

## 2011-09-14 MED ORDER — ETONOGESTREL 68 MG ~~LOC~~ IMPL
68.0000 mg | DRUG_IMPLANT | Freq: Once | SUBCUTANEOUS | Status: AC
  Administered 2011-09-14: 68 mg via SUBCUTANEOUS

## 2011-09-14 NOTE — Patient Instructions (Signed)
Nexplanon after-care Leave outer dressing on for 24 hours. Leave gauze bandage on for 5 days Call your provider or come to Maternity Admissions for: Heavy bleeding Fever >100.4  Severe pain not controlled with Ibuprofen  Etonogestrel implant What is this medicine? ETONOGESTREL is a contraceptive (birth control) device. It is used to prevent pregnancy. It can be used for up to 3 years. This medicine may be used for other purposes; ask your health care provider or pharmacist if you have questions. What should I tell my health care provider before I take this medicine? They need to know if you have any of these conditions: -abnormal vaginal bleeding -blood vessel disease or blood clots -cancer of the breast, cervix, or liver -depression -diabetes -gallbladder disease -headaches -heart disease or recent heart attack -high blood pressure -high cholesterol -kidney disease -liver disease -renal disease -seizures -tobacco smoker -an unusual or allergic reaction to etonogestrel, other hormones, anesthetics or antiseptics, medicines, foods, dyes, or preservatives -pregnant or trying to get pregnant -breast-feeding How should I use this medicine? This device is inserted just under the skin on the inner side of your upper arm by a health care professional. Talk to your pediatrician regarding the use of this medicine in children. Special care may be needed. Overdosage: If you think you've taken too much of this medicine contact a poison control center or emergency room at once. Overdosage: If you think you have taken too much of this medicine contact a poison control center or emergency room at once. NOTE: This medicine is only for you. Do not share this medicine with others. What if I miss a dose? This does not apply. What may interact with this medicine? Do not take this medicine with any of the following medications: -amprenavir -bosentan -fosamprenavir This medicine may also  interact with the following medications: -barbiturate medicines for inducing sleep or treating seizures -certain medicines for fungal infections like ketoconazole and itraconazole -griseofulvin -medicines to treat seizures like carbamazepine, felbamate, oxcarbazepine, phenytoin, topiramate -modafinil -phenylbutazone -rifampin -some medicines to treat HIV infection like atazanavir, indinavir, lopinavir, nelfinavir, tipranavir, ritonavir -St. John's wort This list may not describe all possible interactions. Give your health care provider a list of all the medicines, herbs, non-prescription drugs, or dietary supplements you use. Also tell them if you smoke, drink alcohol, or use illegal drugs. Some items may interact with your medicine. What should I watch for while using this medicine? This product does not protect you against HIV infection (AIDS) or other sexually transmitted diseases. You should be able to feel the implant by pressing your fingertips over the skin where it was inserted. Tell your doctor if you cannot feel the implant. What side effects may I notice from receiving this medicine? Side effects that you should report to your doctor or health care professional as soon as possible: -allergic reactions like skin rash, itching or hives, swelling of the face, lips, or tongue -breast lumps -changes in vision -confusion, trouble speaking or understanding -dark urine -depressed mood -general ill feeling or flu-like symptoms -light-colored stools -loss of appetite, nausea -right upper belly pain -severe headaches -severe pain, swelling, or tenderness in the abdomen -shortness of breath, chest pain, swelling in a leg -signs of pregnancy -sudden numbness or weakness of the face, arm or leg -trouble walking, dizziness, loss of balance or coordination -unusual vaginal bleeding, discharge -unusually weak or tired -yellowing of the eyes or skin Side effects that usually do not  require medical attention (Report these to your doctor  or health care professional if they continue or are bothersome.): -acne -breast pain -changes in weight -cough -fever or chills -headache -irregular menstrual bleeding -itching, burning, and vaginal discharge -pain or difficulty passing urine -sore throat This list may not describe all possible side effects. Call your doctor for medical advice about side effects. You may report side effects to FDA at 1-800-FDA-1088. Where should I keep my medicine? This drug is given in a hospital or clinic and will not be stored at home. NOTE: This sheet is a summary. It may not cover all possible information. If you have questions about this medicine, talk to your doctor, pharmacist, or health care provider.  2012, [employer]. (01/07/2009 3:54:17 PM)

## 2011-09-14 NOTE — Progress Notes (Signed)
Patient given informed consent, signed copy in the chart, time out was performed. Pregnancy test was negative. Appropriate time out taken. Insertion area 9 cm medial to lateral epicondyle was identified.  Pt was prepped with betadine and then injected with 3 cc of 1% lidocaine with epinephrine.  Implanon removed form packaging,  Device confirmed in needle, then inserted full length of needle and withdrawn per handbook instructions.  Pt insertion site covered with pressure dressing and kerlex.   Minimal blood loss.  Pt tolerated the procedure well.  Reviewed warning signs of complication. Back birth control for 7 days.  Pt had abscess in front of urethra drained at time of vaginal delivery. Did not recur. Culture pos E.Coli. No ABX at this time. Do not suspect urethral diverticulum. F/U PRN.    F/U in 5 weeks.  Katrinka Blazing, Bryannah Boston 09/14/2011 11:05 AM

## 2011-10-18 ENCOUNTER — Encounter: Payer: Self-pay | Admitting: Obstetrics & Gynecology

## 2011-10-19 ENCOUNTER — Ambulatory Visit: Payer: Self-pay | Admitting: Obstetrics & Gynecology

## 2012-06-08 ENCOUNTER — Ambulatory Visit: Payer: Self-pay | Admitting: Family Medicine

## 2012-06-08 VITALS — BP 144/93 | HR 83 | Temp 98.1°F | Resp 16 | Ht 67.5 in | Wt 155.6 lb

## 2012-06-08 DIAGNOSIS — B86 Scabies: Secondary | ICD-10-CM

## 2012-06-08 MED ORDER — HYDROXYZINE HCL 25 MG PO TABS: 12.5000 mg | ORAL_TABLET | Freq: Four times a day (QID) | ORAL | Status: DC | PRN

## 2012-06-08 MED ORDER — PERMETHRIN 5 % EX CREA: TOPICAL_CREAM | Freq: Once | CUTANEOUS | Status: DC

## 2012-06-08 NOTE — Patient Instructions (Addendum)
Scabies Scabies are small bugs (mites) that burrow under the skin and cause red bumps and severe itching. These bugs can only be seen with a microscope. Scabies are highly contagious. They can spread easily from person to person by direct contact. They are also spread through sharing clothing or linens that have the scabies mites living in them. It is not unusual for an entire family to become infected through shared towels, clothing, or bedding.  HOME CARE INSTRUCTIONS   Your caregiver may prescribe a cream or lotion to kill the mites. If cream is prescribed, massage the cream into the entire body from the neck to the bottom of both feet. Also massage the cream into the scalp and face if your child is less than 1 year old. Avoid the eyes and mouth. Do not wash your hands after application.  Leave the cream on for 8 to 12 hours. Your child should bathe or shower after the 8 to 12 hour application period. Sometimes it is helpful to apply the cream to your child right before bedtime.  One treatment is usually effective and will eliminate approximately 95% of infestations. For severe cases, your caregiver may decide to repeat the treatment in 1 week. Everyone in your household should be treated with one application of the cream.  New rashes or burrows should not appear within 24 to 48 hours after successful treatment. However, the itching and rash may last for 2 to 4 weeks after successful treatment. Your caregiver may prescribe a medicine to help with the itching or to help the rash go away more quickly.  Scabies can live on clothing or linens for up to 3 days. All of your child's recently used clothing, towels, stuffed toys, and bed linens should be washed in hot water and then dried in a dryer for at least 20 minutes on high heat. Items that cannot be washed should be enclosed in a plastic bag for at least 3 days.  To help relieve itching, bathe your child in a cool bath or apply cool washcloths to the  affected areas.  Your child may return to school after treatment with the prescribed cream. SEEK MEDICAL CARE IF:   The itching persists longer than 4 weeks after treatment.  The rash spreads or becomes infected. Signs of infection include red blisters or yellow-tan crust. Document Released: 04/16/2005 Document Revised: 07/09/2011 Document Reviewed: 08/25/2008 [employer] Patient Information 2013 [employer].  

## 2012-06-08 NOTE — Progress Notes (Signed)
Subjective:    Patient ID: Debra Delgado, female    DOB: 11-02-1975, 37 y.o.   MRN: [id number] Chief Complaint  Patient presents with  . Rash    itching, 21 days, spreading    HPI  Debra Delgado is a pleasant 37 yo woman who 3 wks prev developed a rash on her perineum and on her upper inner thighs - bumpy and HIGHLY pruritic. Since then it has spread to a variety of places - under and between her breasts, very bad in her bilateral axilla, some on the flexor surfaces of her elbows and knees and wrists - very itchy - keeping her awake all night.  No one else w/ rash at home.  Has never had anything like this prior. No h/o eczema or allergies. Has not used any new products - detergents, soaps, lotions, powders, etc. No new foods or exposures.  Has tried hydrating lotions w/o any improvement.  Past Medical History  Diagnosis Date  . Hypertension   . Female circumcision   . Labial abscess   . AMA (advanced maternal age) multigravida 35+    Current Outpatient Prescriptions on File Prior to Visit  Medication Sig Dispense Refill  . calcium carbonate (TUMS EX) 750 MG chewable tablet Chew 1 tablet by mouth as needed.      . ferrous sulfate 325 (65 FE) MG tablet Take 1 tablet (325 mg total) by mouth 2 (two) times daily with a meal.  60 tablet  3  . Prenatal Vit-Fe Psac Cmplx-FA (PRENATAL MULTIVITAMIN) 60-1 MG tablet Take 1 tablet by mouth daily.         No current facility-administered medications on file prior to visit.   No Known Allergies  Review of Systems  Constitutional: Negative for fever, chills and diaphoresis.  Musculoskeletal: Negative for joint swelling and arthralgias.  Skin: Positive for color change and rash. Negative for pallor and wound.  Hematological: Negative for adenopathy. Does not bruise/bleed easily.  Psychiatric/Behavioral: Positive for sleep disturbance.      BP 144/93  Pulse 83  Temp(Src) 98.1 F (36.7 C) (Oral)  Resp 16  Ht 5' 7.5" (1.715 m)  Wt 155 lb 9.6 oz (70.58  kg)  BMI 24 kg/m2  SpO2 100%  Breastfeeding? Yes Objective:   Physical Exam  Constitutional: She is oriented to person, place, and time. She appears well-developed and well-nourished. No distress.  HENT:  Head: Normocephalic and atraumatic.  Right Ear: External ear normal.  Eyes: Conjunctivae are normal. No scleral icterus.  Pulmonary/Chest: Effort normal.  Neurological: She is alert and oriented to person, place, and time.  Skin: Skin is warm and dry. Rash noted. Rash is papular. She is not diaphoretic. No erythema.  Clusters of small pinpoint excoriated papules - most noted on bilateral axilla and flexor surfaces of wrists - none on trunk or back - few seems more like short linear papules  Psychiatric: She has a normal mood and affect. Her behavior is normal.      Assessment & Plan:   1. Scabies  permethrin (ELIMITE) 5 % cream   permethrin (ELIMITE) 5 % cream   hydrOXYzine (ATARAX/VISTARIL) 25 MG tablet  Wash everything in hot water and bleach - cleaning instructions and use of elimite reviewed. Not SURE of diagnosis but will do trial of this. If sxs continue unrelieved, RTC for further eval. Meds ordered this encounter  Medications  . permethrin (ELIMITE) 5 % cream    Sig: Apply topically once. Apply from neck down and wash off  after 8 hrs. May repeat once in 14d if re-infection occurs    Dispense:  60 g    Refill:  1  . hydrOXYzine (ATARAX/VISTARIL) 25 MG tablet    Sig: Take 0.5-1 tablets (12.5-25 mg total) by mouth every 6 (six) hours as needed for itching.    Dispense:  30 tablet    Refill:  0

## 2012-06-16 ENCOUNTER — Encounter (HOSPITAL_COMMUNITY): Payer: Self-pay | Admitting: *Deleted

## 2012-06-16 ENCOUNTER — Emergency Department (INDEPENDENT_AMBULATORY_CARE_PROVIDER_SITE_OTHER)
Admission: EM | Admit: 2012-06-16 | Discharge: 2012-06-16 | Disposition: A | Discharge status: Home / Self Care | Payer: Self-pay | Source: Home / Self Care | Attending: Emergency Medicine | Admitting: Emergency Medicine

## 2012-06-16 DIAGNOSIS — L309 Dermatitis, unspecified: Secondary | ICD-10-CM

## 2012-06-16 DIAGNOSIS — L259 Unspecified contact dermatitis, unspecified cause: Secondary | ICD-10-CM

## 2012-06-16 MED ORDER — TRIAMCINOLONE ACETONIDE 0.5 % EX OINT: TOPICAL_OINTMENT | Freq: Two times a day (BID) | CUTANEOUS | Status: DC

## 2012-06-16 MED ORDER — PREDNISONE 10 MG PO TABS: ORAL_TABLET | ORAL | Status: DC

## 2012-06-16 NOTE — ED Provider Notes (Signed)
Medical screening examination/treatment/procedure(s) were performed by resident physician or non-physician practitioner and as supervising physician I was immediately available for consultation/collaboration.   Luisa Louk DOUGLAS MD.   Arland Usery D Akeia Perot, MD 06/16/12 1958 

## 2012-06-16 NOTE — ED Notes (Signed)
Pt  Was   Seen  8  Days  Ago  At  [facility]    Medical  For  Possible  Scabies  Was  rx  permatarin  Cream  And  Vistaril   She  Reports  meds  Not  Helping and  Rash is  Worse

## 2012-06-16 NOTE — ED Provider Notes (Signed)
History     CSN: [id number]  Arrival date & time 06/16/12  1631   First MD Initiated Contact with Patient 06/16/12 1707      Chief Complaint  Patient presents with  . Rash    (Consider location/radiation/quality/duration/timing/severity/associated sxs/prior treatment) Patient is a 37 y.o. female presenting with rash. The history is provided by the patient. No language interpreter was used.  Rash Location:  Full body Quality: itchiness and redness   Severity:  Moderate Onset quality:  Gradual Timing:  Constant Progression:  Worsening Chronicity:  New Relieved by:  Nothing Worsened by:  Nothing tried Pt has had a rash for several weeks,  T[facility] at [facility] [place] for scabies  Past Medical History  Diagnosis Date  . Hypertension   . Female circumcision   . Labial abscess   . AMA (advanced maternal age) multigravida 35+     Past Surgical History  Procedure Laterality Date  . No past surgeries      Family History  Problem Relation Age of Onset  . Hypertension Mother   . Diabetes Mother   . Hypertension Brother   . Hypertension Sister     History  Substance Use Topics  . Smoking status: Never Smoker   . Smokeless tobacco: Never Used  . Alcohol Use: No    OB History   Grav Para Term Preterm Abortions TAB SAB Ect Mult Living   2 1 1  1  1   1       Review of Systems  Skin: Positive for rash.  All other systems reviewed and are negative.    Allergies  Review of patient's allergies indicates no known allergies.  Home Medications   Current Outpatient Rx  Name  Route  Sig  Dispense  Refill  . calcium carbonate (TUMS EX) 750 MG chewable tablet   Oral   Chew 1 tablet by mouth as needed.         . ferrous sulfate 325 (65 FE) MG tablet   Oral   Take 1 tablet (325 mg total) by mouth 2 (two) times daily with a meal.   60 tablet   3   . hydrOXYzine (ATARAX/VISTARIL) 25 MG tablet   Oral   Take 0.5-1 tablets (12.5-25 mg total) by mouth every 6  (six) hours as needed for itching.   30 tablet   0   . permethrin (ELIMITE) 5 % cream   Topical   Apply topically once. Apply from neck down and wash off after 8 hrs. May repeat once in 14d if re-infection occurs   60 g   1   . Prenatal Vit-Fe Psac Cmplx-FA (PRENATAL MULTIVITAMIN) 60-1 MG tablet   Oral   Take 1 tablet by mouth daily.             BP 128/72  Pulse 72  Temp(Src) 98.6 F (37 C) (Oral)  Resp 18  SpO2 100%  Breastfeeding? Yes  Physical Exam  Nursing note and vitals reviewed. Constitutional: She appears well-developed and well-nourished.  HENT:  Head: Normocephalic.  Eyes: Pupils are equal, round, and reactive to light.  Cardiovascular: Normal rate.   Pulmonary/Chest: Effort normal.  Musculoskeletal: Normal range of motion.  Neurological: She is alert.  Skin: Rash noted.  Dry macular papular areas,    Psychiatric: She has a normal mood and affect.    ED Course  Procedures (including critical care time)  Labs Reviewed - No data to display No results found.   1. Eczema  MDM  Rash looks like bad eczema.  I will treat with trimacinalone and oral prednisone.  I advised pt to see dermatology for evaluation and        Elson Areas, [place] 06/16/12 1755

## 2014-02-26 ENCOUNTER — Inpatient Hospital Stay (HOSPITAL_COMMUNITY)
Admission: AD | Admit: 2014-02-26 | Discharge: 2014-02-26 | Disposition: A | Discharge status: Home / Self Care | Payer: 59 | Source: Ambulatory Visit | Attending: Family Medicine | Admitting: Family Medicine

## 2014-02-26 ENCOUNTER — Encounter (HOSPITAL_COMMUNITY): Payer: Self-pay

## 2014-02-26 DIAGNOSIS — O09521 Supervision of elderly multigravida, first trimester: Secondary | ICD-10-CM | POA: Diagnosis not present

## 2014-02-26 DIAGNOSIS — O3441 Maternal care for other abnormalities of cervix, first trimester: Secondary | ICD-10-CM | POA: Diagnosis not present

## 2014-02-26 DIAGNOSIS — N90810 Female genital mutilation status, unspecified: Secondary | ICD-10-CM | POA: Insufficient documentation

## 2014-02-26 DIAGNOSIS — O3481 Maternal care for other abnormalities of pelvic organs, first trimester: Secondary | ICD-10-CM | POA: Diagnosis not present

## 2014-02-26 DIAGNOSIS — O10011 Pre-existing essential hypertension complicating pregnancy, first trimester: Secondary | ICD-10-CM | POA: Insufficient documentation

## 2014-02-26 DIAGNOSIS — N841 Polyp of cervix uteri: Secondary | ICD-10-CM | POA: Diagnosis not present

## 2014-02-26 DIAGNOSIS — Z3A12 12 weeks gestation of pregnancy: Secondary | ICD-10-CM | POA: Diagnosis not present

## 2014-02-26 DIAGNOSIS — O209 Hemorrhage in early pregnancy, unspecified: Secondary | ICD-10-CM | POA: Diagnosis present

## 2014-02-26 LAB — URINALYSIS, ROUTINE W REFLEX MICROSCOPIC
BILIRUBIN URINE: NEGATIVE
Glucose, UA: NEGATIVE mg/dL
Hgb urine dipstick: NEGATIVE
KETONES UR: NEGATIVE mg/dL
LEUKOCYTES UA: NEGATIVE
NITRITE: NEGATIVE
PROTEIN: NEGATIVE mg/dL
Specific Gravity, Urine: 1.02 (ref 1.005–1.030)
UROBILINOGEN UA: 0.2 mg/dL (ref 0.0–1.0)
pH: 6 (ref 5.0–8.0)

## 2014-02-26 LAB — CBC
HEMATOCRIT: 36.9 % (ref 36.0–46.0)
Hemoglobin: 12 g/dL (ref 12.0–15.0)
MCH: 24.8 pg — ABNORMAL LOW (ref 26.0–34.0)
MCHC: 32.5 g/dL (ref 30.0–36.0)
MCV: 76.4 fL — ABNORMAL LOW (ref 78.0–100.0)
PLATELETS: 332 10*3/uL (ref 150–400)
RBC: 4.83 MIL/uL (ref 3.87–5.11)
RDW: 15.2 % (ref 11.5–15.5)
WBC: 6.8 10*3/uL (ref 4.0–10.5)

## 2014-02-26 LAB — WET PREP, GENITAL
Clue Cells Wet Prep HPF POC: NONE SEEN
Trich, Wet Prep: NONE SEEN
Yeast Wet Prep HPF POC: NONE SEEN

## 2014-02-26 LAB — POCT PREGNANCY, URINE: PREG TEST UR: POSITIVE — AB

## 2014-02-26 NOTE — MAU Note (Signed)
Pt presents complaining of spotting when she wipes. States she has had it for 2-3 days. Complains of sharp lower abdominal pain when she moves. States it only lasts for a second. Denies other vaginal discharge. HX of miscarriage with D&C in 2011 where she experienced similar symptoms

## 2014-02-26 NOTE — MAU Provider Note (Signed)
History     CSN: [id number]  Arrival date and time: 02/26/14 1531   First Provider Initiated Contact with Patient 02/26/14 1634      Chief Complaint  Patient presents with  . Vaginal Bleeding   HPI  Ms. Melina Schoolssmhan Guion is a 38 y.o. female G3P1011 who presents with vaginal bleeding. She had bleeding two days ago, however has no bleeding today. Last intercourse was 10 days ago. She plans to start prenatal care at the clinic, however has not called because she does not know the phone number. She presents today because she wants to make sure the baby is alive.  OB History   Grav Para Term Preterm Abortions TAB SAB Ect Mult Living   3 1 1  1  1   1       Past Medical History  Diagnosis Date  . Hypertension   . Female circumcision   . Labial abscess   . AMA (advanced maternal age) multigravida 35+     Past Surgical History  Procedure Laterality Date  . No past surgeries      Family History  Problem Relation Age of Onset  . Hypertension Mother   . Diabetes Mother   . Hypertension Brother   . Hypertension Sister     History  Substance Use Topics  . Smoking status: Never Smoker   . Smokeless tobacco: Never Used  . Alcohol Use: No    Allergies: No Known Allergies  Prescriptions prior to admission  Medication Sig Dispense Refill  . acetaminophen (TYLENOL) 500 MG tablet Take 500 mg by mouth every 6 (six) hours as needed for headache.       Results for orders placed during the hospital encounter of 02/26/14 (from the past 48 hour(s))  URINALYSIS, ROUTINE W REFLEX MICROSCOPIC     Status: None   Collection Time    02/26/14  3:40 PM      Result Value Ref Range   Color, Urine YELLOW  YELLOW   APPearance CLEAR  CLEAR   Specific Gravity, Urine 1.020  1.005 - 1.030   pH 6.0  5.0 - 8.0   Glucose, UA NEGATIVE  NEGATIVE mg/dL   Hgb urine dipstick NEGATIVE  NEGATIVE   Bilirubin Urine NEGATIVE  NEGATIVE   Ketones, ur NEGATIVE  NEGATIVE mg/dL   Protein, ur NEGATIVE  NEGATIVE  mg/dL   Urobilinogen, UA 0.2  0.0 - 1.0 mg/dL   Nitrite NEGATIVE  NEGATIVE   Leukocytes, UA NEGATIVE  NEGATIVE   Comment: MICROSCOPIC NOT DONE ON URINES WITH NEGATIVE PROTEIN, BLOOD, LEUKOCYTES, NITRITE, OR GLUCOSE <1000 mg/dL.  POCT PREGNANCY, URINE     Status: Abnormal   Collection Time    02/26/14  3:46 PM      Result Value Ref Range   Preg Test, Ur POSITIVE (*) NEGATIVE   Comment:            THE SENSITIVITY OF THIS     METHODOLOGY IS >24 mIU/mL  CBC     Status: Abnormal   Collection Time    02/26/14  4:44 PM      Result Value Ref Range   WBC 6.8  4.0 - 10.5 K/uL   RBC 4.83  3.87 - 5.11 MIL/uL   Hemoglobin 12.0  12.0 - 15.0 g/dL   HCT 65.736.9  84.636.0 - 96.246.0 %   MCV 76.4 (*) 78.0 - 100.0 fL   MCH 24.8 (*) 26.0 - 34.0 pg   MCHC 32.5  30.0 - 36.0 g/dL  RDW 15.2  11.5 - 15.5 %   Platelets 332  150 - 400 K/uL  WET PREP, GENITAL     Status: Abnormal   Collection Time    02/26/14  4:45 PM      Result Value Ref Range   Yeast Wet Prep HPF POC NONE SEEN  NONE SEEN   Trich, Wet Prep NONE SEEN  NONE SEEN   Clue Cells Wet Prep HPF POC NONE SEEN  NONE SEEN   WBC, Wet Prep HPF POC FEW (*) NONE SEEN   Comment: MANY BACTERIA SEEN    Review of Systems  Constitutional: Negative for fever and chills.  Gastrointestinal: Positive for nausea and vomiting. Negative for abdominal pain, diarrhea and constipation.  Genitourinary: Negative for dysuria, urgency, frequency and hematuria.       No vaginal discharge. No vaginal bleeding. No dysuria.    Physical Exam   Blood pressure 144/98, pulse 112, temperature 98.3 F (36.8 C), temperature source Oral, resp. rate 18, last menstrual period 11/23/2013, currently breastfeeding.  Physical Exam  Constitutional: She is oriented to person, place, and time. She appears well-developed and well-nourished. No distress.  HENT:  Head: Normocephalic.  Eyes: Pupils are equal, round, and reactive to light.  Neck: Neck supple.  Respiratory: Effort normal.   GI: Soft.  Genitourinary:  Speculum exam: Vagina - Small amount of creamy discharge, no odor Cervix - No contact bleeding, pea size polyp noted at cervix; no bleeding with contact, non tender  Bimanual exam: Cervix closed Uterus non tender, normal size Adnexa non tender, no masses bilaterally GC/Chlam, wet prep done Chaperone present for exam.   Musculoskeletal: Normal range of motion.  Neurological: She is alert and oriented to person, place, and time.  Skin: Skin is warm. She is not diaphoretic.  Psychiatric: Her behavior is normal.    MAU Course  Procedures None  MDM +fht.  GC Wet prep HIV CBC O positive blood type  Assessment and Plan   A: 1. Polyp at cervical os    P: Discharge home in stable condition Start prenatal care ASAP Bleeding precautions [facility]   Iona HansenJennifer Irene Tyjon Bowen, NP 02/26/2014 8:37 PM

## 2014-02-27 LAB — GC/CHLAMYDIA PROBE AMP
CT Probe RNA: NEGATIVE
GC Probe RNA: NEGATIVE

## 2014-02-27 LAB — HIV ANTIBODY (ROUTINE TESTING W REFLEX): HIV 1&2 Ab, 4th Generation: NONREACTIVE

## 2014-02-27 NOTE — MAU Provider Note (Signed)
Attestation of Attending Supervision of Advanced Practitioner (PA/CNM/NP): Evaluation and management procedures were performed by the Advanced Practitioner under my supervision and collaboration.  I have reviewed the Advanced Practitioner's note and chart, and I agree with the management and plan.  Epifanio Labrador S, MD [facility] Attending 02/27/2014 12:11 AM   

## 2014-03-01 ENCOUNTER — Encounter (HOSPITAL_COMMUNITY): Payer: Self-pay

## 2014-04-14 ENCOUNTER — Ambulatory Visit (INDEPENDENT_AMBULATORY_CARE_PROVIDER_SITE_OTHER): Payer: 59 | Admitting: Advanced Practice Midwife

## 2014-04-14 ENCOUNTER — Encounter: Payer: Self-pay | Admitting: Advanced Practice Midwife

## 2014-04-14 VITALS — BP 115/78 | HR 97 | Temp 98.0°F | Wt 149.0 lb

## 2014-04-14 DIAGNOSIS — Z3492 Encounter for supervision of normal pregnancy, unspecified, second trimester: Secondary | ICD-10-CM

## 2014-04-14 DIAGNOSIS — Z23 Encounter for immunization: Secondary | ICD-10-CM

## 2014-04-14 DIAGNOSIS — O0990 Supervision of high risk pregnancy, unspecified, unspecified trimester: Secondary | ICD-10-CM | POA: Insufficient documentation

## 2014-04-14 DIAGNOSIS — O0932 Supervision of pregnancy with insufficient antenatal care, second trimester: Secondary | ICD-10-CM

## 2014-04-14 DIAGNOSIS — K219 Gastro-esophageal reflux disease without esophagitis: Secondary | ICD-10-CM

## 2014-04-14 DIAGNOSIS — Z3482 Encounter for supervision of other normal pregnancy, second trimester: Secondary | ICD-10-CM

## 2014-04-14 DIAGNOSIS — O99612 Diseases of the digestive system complicating pregnancy, second trimester: Secondary | ICD-10-CM

## 2014-04-14 LAB — POCT URINALYSIS DIP (DEVICE)
Bilirubin Urine: NEGATIVE
Glucose, UA: NEGATIVE mg/dL
HGB URINE DIPSTICK: NEGATIVE
KETONES UR: NEGATIVE mg/dL
LEUKOCYTES UA: NEGATIVE
Nitrite: NEGATIVE
PROTEIN: NEGATIVE mg/dL
Specific Gravity, Urine: 1.025 (ref 1.005–1.030)
Urobilinogen, UA: 0.2 mg/dL (ref 0.0–1.0)
pH: 5.5 (ref 5.0–8.0)

## 2014-04-14 MED ORDER — FAMOTIDINE 20 MG PO TABS: 20.0000 mg | ORAL_TABLET | Freq: Two times a day (BID) | ORAL | Status: DC

## 2014-04-14 MED ORDER — PRENATAL PLUS 27-1 MG PO TABS: 1.0000 | ORAL_TABLET | Freq: Every day | ORAL | Status: DC

## 2014-04-14 NOTE — Progress Notes (Signed)
   Subjective:    Debra Delgado is a G3P1011 6161w2d being seen today for her first obstetrical visit.  Her obstetrical history is significant for Hx GDM. AMA, Female circ. Patient does intend to breast feed. Pregnancy history fully reviewed. Normal gyn exam and Pap at [facility] 08/17/13.   Patient reports no bleeding, no contractions and no leaking.  Filed Vitals:   04/14/14 1012  BP: 115/78  Pulse: 97  Temp: 98 F (36.7 C)  Weight: 149 lb (67.586 kg)   HISTORY: OB History  Gravida Para Term Preterm AB SAB TAB Ectopic Multiple Living  3 1 1  1 1    1     # Outcome Date GA Lbr Len/2nd Weight Sex Delivery Anes PTL Lv  3 Current           2 Term 07/28/11 1421w1d 10:11 / 05:13 8 lb 5.9 oz (3.795 kg) F Vag-Spont EPI  Y  1 SAB 11/2009 7140w0d            Past Medical History  Diagnosis Date  . Hypertension   . Female circumcision   . Labial abscess   . AMA (advanced maternal age) multigravida 35+    Past Surgical History  Procedure Laterality Date  . No past surgeries     Family History  Problem Relation Age of Onset  . Hypertension Mother   . Diabetes Mother   . Hypertension Brother   . Hypertension Sister      Exam    Uterus:   Fundal height 19 cm  Pelvic Exam: Deferred due to recent exam   Bony Pelvis: proven to 8-5  System: Breast:  normal appearance, no masses or tenderness, Declined due to recent exam   Skin: normal coloration and turgor, no rashes    Neurologic: oriented, normal mood, grossly non-focal   Extremities: normal strength, tone, and muscle mass, no edema   HEENT sclera clear, anicteric   Mouth/Teeth dental hygiene good   Neck no masses   Cardiovascular: regular rate and rhythm, no murmurs or gallops   Respiratory:  appears well, vitals normal, no respiratory distress, acyanotic, normal RR, chest clear, no wheezing, crepitations, rhonchi, normal symmetric air entry   Abdomen: soft, non-tender; bowel sounds normal; no masses,  no organomegaly       Assessment:    Pregnancy: G3P1011 1. Late prenatal care, second trimester  - Prescript Monitor Profile(19) - Culture, OB Urine - Prenatal Profile - Hemoglobinopathy evaluation - Glucose Tolerance, 1 HR (50g) w/o Fasting - US OB Comp + 14 Wk; Future  2. Supervision of normal pregnancy in second trimester  3. Gastroesophageal reflux during pregnancy in second trimester, antepartum  - famotidine (PEPCID) 20 MG tablet; Take 1 tablet (20 mg total) by mouth 2 (two) times daily.  Dispense: 60 tablet; Refill: 6  4. Need for prophylactic vaccination and inoculation against influenza  - Flu Vaccine QUAD 36+ mos IM; Standing - Flu Vaccine QUAD 36+ mos IM     Plan:     Initial labs drawn. Early 1 hour GTT. Prenatal vitamins. Problem list reviewed and updated. Genetic Screening discussed Quad Screen: declined. Ultrasound discussed; fetal survey: ordered. Follow up in 4 weeks. 50% of 45 min visit spent on counseling and coordination of care.    Dorathy KinsmanSMITH, Obrian Bulson 04/14/2014

## 2014-04-14 NOTE — Progress Notes (Signed)
[facility] per patient. Cultures done in MAU.  Pt not certain on lmp but knows that it was the end of July or early august. She has felt movement from the baby and had positive fetal heart tones in MAU. Pt had gestational diabetes with last pregnancy, early 1 hr today.

## 2014-04-14 NOTE — Patient Instructions (Signed)
Second Trimester of Pregnancy The second trimester is from week 13 through week 28, months 4 through 6. The second trimester is often a time when you feel your best. Your body has also adjusted to being pregnant, and you begin to feel better physically. Usually, morning sickness has lessened or quit completely, you may have more energy, and you may have an increase in appetite. The second trimester is also a time when the fetus is growing rapidly. At the end of the sixth month, the fetus is about 9 inches long and weighs about 1 pounds. You will likely begin to feel the baby move (quickening) between 18 and 20 weeks of the pregnancy. BODY CHANGES Your body goes through many changes during pregnancy. The changes vary from woman to woman.   Your weight will continue to increase. You will notice your lower abdomen bulging out.  You may begin to get stretch marks on your hips, abdomen, and breasts.  You may develop headaches that can be relieved by medicines approved by your health care provider.  You may urinate more often because the fetus is pressing on your bladder.  You may develop or continue to have heartburn as a result of your pregnancy.  You may develop constipation because certain hormones are causing the muscles that push waste through your intestines to slow down.  You may develop hemorrhoids or swollen, bulging veins (varicose veins).  You may have back pain because of the weight gain and pregnancy hormones relaxing your joints between the bones in your pelvis and as a result of a shift in weight and the muscles that support your balance.  Your breasts will continue to grow and be tender.  Your gums may bleed and may be sensitive to brushing and flossing.  Dark spots or blotches (chloasma, mask of pregnancy) may develop on your face. This will likely fade after the baby is born.  A dark line from your belly button to the pubic area (linea nigra) may appear. This will likely  fade after the baby is born.  You may have changes in your hair. These can include thickening of your hair, rapid growth, and changes in texture. Some women also have hair loss during or after pregnancy, or hair that feels dry or thin. Your hair will most likely return to normal after your baby is born. WHAT TO EXPECT AT YOUR PRENATAL VISITS During a routine prenatal visit:  You will be weighed to make sure you and the fetus are growing normally.  Your blood pressure will be taken.  Your abdomen will be measured to track your baby's growth.  The fetal heartbeat will be listened to.  Any test results from the previous visit will be discussed. Your health care provider may ask you:  How you are feeling.  If you are feeling the baby move.  If you have had any abnormal symptoms, such as leaking fluid, bleeding, severe headaches, or abdominal cramping.  If you have any questions. Other tests that may be performed during your second trimester include:  Blood tests that check for:  Low iron levels (anemia).  Gestational diabetes (between 24 and 28 weeks).  Rh antibodies.  Urine tests to check for infections, diabetes, or protein in the urine.  An ultrasound to confirm the proper growth and development of the baby.  An amniocentesis to check for possible genetic problems.  Fetal screens for spina bifida and Down syndrome. HOME CARE INSTRUCTIONS   Avoid all smoking, herbs, alcohol, and unprescribed   drugs. These chemicals affect the formation and growth of the baby.  Follow your health care provider's instructions regarding medicine use. There are medicines that are either safe or unsafe to take during pregnancy.  Exercise only as directed by your health care provider. Experiencing uterine cramps is a good sign to stop exercising.  Continue to eat regular, healthy meals.  Wear a good support bra for breast tenderness.  Do not use hot tubs, steam rooms, or saunas.  Wear  your seat belt at all times when driving.  Avoid raw meat, uncooked cheese, cat litter boxes, and soil used by cats. These carry germs that can cause birth defects in the baby.  Take your prenatal vitamins.  Try taking a stool softener (if your health care provider approves) if you develop constipation. Eat more high-fiber foods, such as fresh vegetables or fruit and whole grains. Drink plenty of fluids to keep your urine clear or pale yellow.  Take warm sitz baths to soothe any pain or discomfort caused by hemorrhoids. Use hemorrhoid cream if your health care provider approves.  If you develop varicose veins, wear support hose. Elevate your feet for 15 minutes, 3-4 times a day. Limit salt in your diet.  Avoid heavy lifting, wear low heel shoes, and practice good posture.  Rest with your legs elevated if you have leg cramps or low back pain.  Visit your dentist if you have not gone yet during your pregnancy. Use a soft toothbrush to brush your teeth and be gentle when you floss.  A sexual relationship may be continued unless your health care provider directs you otherwise.  Continue to go to all your prenatal visits as directed by your health care provider. SEEK MEDICAL CARE IF:   You have dizziness.  You have mild pelvic cramps, pelvic pressure, or nagging pain in the abdominal area.  You have persistent nausea, vomiting, or diarrhea.  You have a bad smelling vaginal discharge.  You have pain with urination. SEEK IMMEDIATE MEDICAL CARE IF:   You have a fever.  You are leaking fluid from your vagina.  You have spotting or bleeding from your vagina.  You have severe abdominal cramping or pain.  You have rapid weight gain or loss.  You have shortness of breath with chest pain.  You notice sudden or extreme swelling of your face, hands, ankles, feet, or legs.  You have not felt your baby move in over an hour.  You have severe headaches that do not go away with  medicine.  You have vision changes. Document Released: 04/10/2001 Document Revised: 04/21/2013 Document Reviewed: 06/17/2012 [employer] Patient Information 2015 [employer]. This information is not intended to replace advice given to you by your health care provider. Make sure you discuss any questions you have with your health care provider.  Breastfeeding Deciding to breastfeed is one of the best choices you can make for you and your baby. A change in hormones during pregnancy causes your breast tissue to grow and increases the number and size of your milk ducts. These hormones also allow proteins, sugars, and fats from your blood supply to make breast milk in your milk-producing glands. Hormones prevent breast milk from being released before your baby is born as well as prompt milk flow after birth. Once breastfeeding has begun, thoughts of your baby, as well as his or her sucking or crying, can stimulate the release of milk from your milk-producing glands.  BENEFITS OF BREASTFEEDING For Your Baby  Your first   milk (colostrum) helps your baby's digestive system function better.   There are antibodies in your milk that help your baby fight off infections.   Your baby has a lower incidence of asthma, allergies, and sudden infant death syndrome.   The nutrients in breast milk are better for your baby than infant formulas and are designed uniquely for your baby's needs.   Breast milk improves your baby's brain development.   Your baby is less likely to develop other conditions, such as childhood obesity, asthma, or type 2 diabetes mellitus.  For You   Breastfeeding helps to create a very special bond between you and your baby.   Breastfeeding is convenient. Breast milk is always available at the correct temperature and costs nothing.   Breastfeeding helps to burn calories and helps you lose the weight gained during pregnancy.   Breastfeeding makes your uterus contract to its  prepregnancy size faster and slows bleeding (lochia) after you give birth.   Breastfeeding helps to lower your risk of developing type 2 diabetes mellitus, osteoporosis, and breast or ovarian cancer later in life. SIGNS THAT YOUR BABY IS HUNGRY Early Signs of Hunger  Increased alertness or activity.  Stretching.  Movement of the head from side to side.  Movement of the head and opening of the mouth when the corner of the mouth or cheek is stroked (rooting).  Increased sucking sounds, smacking lips, cooing, sighing, or squeaking.  Hand-to-mouth movements.  Increased sucking of fingers or hands. Late Signs of Hunger  Fussing.  Intermittent crying. Extreme Signs of Hunger Signs of extreme hunger will require calming and consoling before your baby will be able to breastfeed successfully. Do not wait for the following signs of extreme hunger to occur before you initiate breastfeeding:   Restlessness.  A loud, strong cry.   Screaming. BREASTFEEDING BASICS Breastfeeding Initiation  Find a comfortable place to sit or lie down, with your neck and back well supported.  Place a pillow or rolled up blanket under your baby to bring him or her to the level of your breast (if you are seated). Nursing pillows are specially designed to help support your arms and your baby while you breastfeed.  Make sure that your baby's abdomen is facing your abdomen.   Gently massage your breast. With your fingertips, massage from your chest wall toward your nipple in a circular motion. This encourages milk flow. You may need to continue this action during the feeding if your milk flows slowly.  Support your breast with 4 fingers underneath and your thumb above your nipple. Make sure your fingers are well away from your nipple and your baby's mouth.   Stroke your baby's lips gently with your finger or nipple.   When your baby's mouth is open wide enough, quickly bring your baby to your breast,  placing your entire nipple and as much of the colored area around your nipple (areola) as possible into your baby's mouth.   More areola should be visible above your baby's upper lip than below the lower lip.   Your baby's tongue should be between his or her lower gum and your breast.   Ensure that your baby's mouth is correctly positioned around your nipple (latched). Your baby's lips should create a seal on your breast and be turned out (everted).  It is common for your baby to suck about 2-3 minutes in order to start the flow of breast milk. Latching Teaching your baby how to latch on to your breast   properly is very important. An improper latch can cause nipple pain and decreased milk supply for you and poor weight gain in your baby. Also, if your baby is not latched onto your nipple properly, he or she may swallow some air during feeding. This can make your baby fussy. Burping your baby when you switch breasts during the feeding can help to get rid of the air. However, teaching your baby to latch on properly is still the best way to prevent fussiness from swallowing air while breastfeeding. Signs that your baby has successfully latched on to your nipple:    Silent tugging or silent sucking, without causing you pain.   Swallowing heard between every 3-4 sucks.    Muscle movement above and in front of his or her ears while sucking.  Signs that your baby has not successfully latched on to nipple:   Sucking sounds or smacking sounds from your baby while breastfeeding.  Nipple pain. If you think your baby has not latched on correctly, slip your finger into the corner of your baby's mouth to break the suction and place it between your baby's gums. Attempt breastfeeding initiation again. Signs of Successful Breastfeeding Signs from your baby:   A gradual decrease in the number of sucks or complete cessation of sucking.   Falling asleep.   Relaxation of his or her body.    Retention of a small amount of milk in his or her mouth.   Letting go of your breast by himself or herself. Signs from you:  Breasts that have increased in firmness, weight, and size 1-3 hours after feeding.   Breasts that are softer immediately after breastfeeding.  Increased milk volume, as well as a change in milk consistency and color by the fifth day of breastfeeding.   Nipples that are not sore, cracked, or bleeding. Signs That Your Baby is Getting Enough Milk  Wetting at least 3 diapers in a 24-hour period. The urine should be clear and pale yellow by age 5 days.  At least 3 stools in a 24-hour period by age 5 days. The stool should be soft and yellow.  At least 3 stools in a 24-hour period by age 7 days. The stool should be seedy and yellow.  No loss of weight greater than 10% of birth weight during the first 3 days of age.  Average weight gain of 4-7 ounces (113-198 g) per week after age 4 days.  Consistent daily weight gain by age 5 days, without weight loss after the age of 2 weeks. After a feeding, your baby may spit up a small amount. This is common. BREASTFEEDING FREQUENCY AND DURATION Frequent feeding will help you make more milk and can prevent sore nipples and breast engorgement. Breastfeed when you feel the need to reduce the fullness of your breasts or when your baby shows signs of hunger. This is called "breastfeeding on demand." Avoid introducing a pacifier to your baby while you are working to establish breastfeeding (the first 4-6 weeks after your baby is born). After this time you may choose to use a pacifier. Research has shown that pacifier use during the first year of a baby's life decreases the risk of sudden infant death syndrome (SIDS). Allow your baby to feed on each breast as long as he or she wants. Breastfeed until your baby is finished feeding. When your baby unlatches or falls asleep while feeding from the first breast, offer the second breast.  Because newborns are often sleepy in the   first few weeks of life, you may need to awaken your baby to get him or her to feed. Breastfeeding times will vary from baby to baby. However, the following rules can serve as a guide to help you ensure that your baby is properly fed:  Newborns (babies 4 weeks of age or younger) may breastfeed every 1-3 hours.  Newborns should not go longer than 3 hours during the day or 5 hours during the night without breastfeeding.  You should breastfeed your baby a minimum of 8 times in a 24-hour period until you begin to introduce solid foods to your baby at around 6 months of age. BREAST MILK PUMPING Pumping and storing breast milk allows you to ensure that your baby is exclusively fed your breast milk, even at times when you are unable to breastfeed. This is especially important if you are going back to work while you are still breastfeeding or when you are not able to be present during feedings. Your lactation consultant can give you guidelines on how long it is safe to store breast milk.  A breast pump is a machine that allows you to pump milk from your breast into a sterile bottle. The pumped breast milk can then be stored in a refrigerator or freezer. Some breast pumps are operated by hand, while others use electricity. Ask your lactation consultant which type will work best for you. Breast pumps can be purchased, but some hospitals and breastfeeding support groups lease breast pumps on a monthly basis. A lactation consultant can teach you how to hand express breast milk, if you prefer not to use a pump.  CARING FOR YOUR BREASTS WHILE YOU BREASTFEED Nipples can become dry, cracked, and sore while breastfeeding. The following recommendations can help keep your breasts moisturized and healthy:  Avoid using soap on your nipples.   Wear a supportive bra. Although not required, special nursing bras and tank tops are designed to allow access to your breasts for  breastfeeding without taking off your entire bra or top. Avoid wearing underwire-style bras or extremely tight bras.  Air dry your nipples for 3-4minutes after each feeding.   Use only cotton bra pads to absorb leaked breast milk. Leaking of breast milk between feedings is normal.   Use lanolin on your nipples after breastfeeding. Lanolin helps to maintain your skin's normal moisture barrier. If you use pure lanolin, you do not need to wash it off before feeding your baby again. Pure lanolin is not toxic to your baby. You may also hand express a few drops of breast milk and gently massage that milk into your nipples and allow the milk to air dry. In the first few weeks after giving birth, some women experience extremely full breasts (engorgement). Engorgement can make your breasts feel heavy, warm, and tender to the touch. Engorgement peaks within 3-5 days after you give birth. The following recommendations can help ease engorgement:  Completely empty your breasts while breastfeeding or pumping. You may want to start by applying warm, moist heat (in the shower or with warm water-soaked hand towels) just before feeding or pumping. This increases circulation and helps the milk flow. If your baby does not completely empty your breasts while breastfeeding, pump any extra milk after he or she is finished.  Wear a snug bra (nursing or regular) or tank top for 1-2 days to signal your body to slightly decrease milk production.  Apply ice packs to your breasts, unless this is too uncomfortable for you.    Make sure that your baby is latched on and positioned properly while breastfeeding. If engorgement persists after 48 hours of following these recommendations, contact your health care provider or a lactation consultant. OVERALL HEALTH CARE RECOMMENDATIONS WHILE BREASTFEEDING  Eat healthy foods. Alternate between meals and snacks, eating 3 of each per day. Because what you eat affects your breast milk,  some of the foods may make your baby more irritable than usual. Avoid eating these foods if you are sure that they are negatively affecting your baby.  Drink milk, fruit juice, and water to satisfy your thirst (about 10 glasses a day).   Rest often, relax, and continue to take your prenatal vitamins to prevent fatigue, stress, and anemia.  Continue breast self-awareness checks.  Avoid chewing and smoking tobacco.  Avoid alcohol and drug use. Some medicines that may be harmful to your baby can pass through breast milk. It is important to ask your health care provider before taking any medicine, including all over-the-counter and prescription medicine as well as vitamin and herbal supplements. It is possible to become pregnant while breastfeeding. If birth control is desired, ask your health care provider about options that will be safe for your baby. SEEK MEDICAL CARE IF:   You feel like you want to stop breastfeeding or have become frustrated with breastfeeding.  You have painful breasts or nipples.  Your nipples are cracked or bleeding.  Your breasts are red, tender, or warm.  You have a swollen area on either breast.  You have a fever or chills.  You have nausea or vomiting.  You have drainage other than breast milk from your nipples.  Your breasts do not become full before feedings by the fifth day after you give birth.  You feel sad and depressed.  Your baby is too sleepy to eat well.  Your baby is having trouble sleeping.   Your baby is wetting less than 3 diapers in a 24-hour period.  Your baby has less than 3 stools in a 24-hour period.  Your baby's skin or the white part of his or her eyes becomes yellow.   Your baby is not gaining weight by 5 days of age. SEEK IMMEDIATE MEDICAL CARE IF:   Your baby is overly tired (lethargic) and does not want to wake up and feed.  Your baby develops an unexplained fever. Document Released: 04/16/2005 Document Revised:  04/21/2013 Document Reviewed: 10/08/2012 [employer] Patient Information 2015 [employer]. This information is not intended to replace advice given to you by your health care provider. Make sure you discuss any questions you have with your health care provider.  

## 2014-04-15 LAB — PRENATAL PROFILE (SOLSTAS)
Antibody Screen: NEGATIVE
Basophils Absolute: 0 10*3/uL (ref 0.0–0.1)
Basophils Relative: 0 % (ref 0–1)
EOS ABS: 0.1 10*3/uL (ref 0.0–0.7)
Eosinophils Relative: 1 % (ref 0–5)
HCT: 33 % — ABNORMAL LOW (ref 36.0–46.0)
HEP B S AG: NEGATIVE
HIV 1&2 Ab, 4th Generation: NONREACTIVE
Hemoglobin: 10.6 g/dL — ABNORMAL LOW (ref 12.0–15.0)
Lymphocytes Relative: 26 % (ref 12–46)
Lymphs Abs: 1.5 10*3/uL (ref 0.7–4.0)
MCH: 24.7 pg — ABNORMAL LOW (ref 26.0–34.0)
MCHC: 32.1 g/dL (ref 30.0–36.0)
MCV: 76.7 fL — ABNORMAL LOW (ref 78.0–100.0)
MONOS PCT: 6 % (ref 3–12)
MPV: 9.2 fL — AB (ref 9.4–12.4)
Monocytes Absolute: 0.4 10*3/uL (ref 0.1–1.0)
NEUTROS PCT: 67 % (ref 43–77)
Neutro Abs: 4 10*3/uL (ref 1.7–7.7)
Platelets: 339 10*3/uL (ref 150–400)
RBC: 4.3 MIL/uL (ref 3.87–5.11)
RDW: 16 % — AB (ref 11.5–15.5)
Rh Type: POSITIVE
Rubella: 29.1 Index — ABNORMAL HIGH (ref <0.90)
WBC: 5.9 10*3/uL (ref 4.0–10.5)

## 2014-04-15 LAB — PRESCRIPTION MONITORING PROFILE (19 PANEL)
Amphetamine/Meth: NEGATIVE ng/mL
BENZODIAZEPINE SCREEN, URINE: NEGATIVE ng/mL
Barbiturate Screen, Urine: NEGATIVE ng/mL
Buprenorphine, Urine: NEGATIVE ng/mL
Cannabinoid Scrn, Ur: NEGATIVE ng/mL
Carisoprodol, Urine: NEGATIVE ng/mL
Cocaine Metabolites: NEGATIVE ng/mL
Creatinine, Urine: 235.11 mg/dL (ref >20.0)
ECSTASY: NEGATIVE ng/mL
Fentanyl, Ur: NEGATIVE ng/mL
METHADONE SCREEN, URINE: NEGATIVE ng/mL
Meperidine, Ur: NEGATIVE ng/mL
Methaqualone: NEGATIVE ng/mL
NITRITES URINE, INITIAL: NEGATIVE ug/mL
Opiate Screen, Urine: NEGATIVE ng/mL
Oxycodone Screen, Ur: NEGATIVE ng/mL
PH URINE, INITIAL: 5.4 pH (ref 4.5–8.9)
PROPOXYPHENE: NEGATIVE ng/mL
Phencyclidine, Ur: NEGATIVE ng/mL
TRAMADOL UR: NEGATIVE ng/mL
Tapentadol, urine: NEGATIVE ng/mL
Zolpidem, Urine: NEGATIVE ng/mL

## 2014-04-15 LAB — GLUCOSE TOLERANCE, 1 HOUR (50G) W/O FASTING: Glucose, 1 Hour GTT: 128 mg/dL (ref 70–140)

## 2014-04-16 LAB — CULTURE, OB URINE
COLONY COUNT: NO GROWTH
Organism ID, Bacteria: NO GROWTH

## 2014-04-16 LAB — HEMOGLOBINOPATHY EVALUATION
HEMOGLOBIN OTHER: 0 %
HGB A2 QUANT: 2.4 % (ref 2.2–3.2)
HGB A: 97.6 % (ref 96.8–97.8)
Hgb F Quant: 0 % (ref 0.0–2.0)
Hgb S Quant: 0 %

## 2014-04-20 ENCOUNTER — Ambulatory Visit (HOSPITAL_COMMUNITY)
Admission: RE | Admit: 2014-04-20 | Discharge: 2014-04-20 | Disposition: A | Discharge status: Home / Self Care | Payer: 59 | Source: Ambulatory Visit | Attending: Advanced Practice Midwife | Admitting: Advanced Practice Midwife

## 2014-04-20 ENCOUNTER — Other Ambulatory Visit: Payer: Self-pay | Admitting: Advanced Practice Midwife

## 2014-04-20 DIAGNOSIS — O0932 Supervision of pregnancy with insufficient antenatal care, second trimester: Secondary | ICD-10-CM | POA: Diagnosis present

## 2014-04-20 DIAGNOSIS — Z3A20 20 weeks gestation of pregnancy: Secondary | ICD-10-CM | POA: Insufficient documentation

## 2014-04-20 DIAGNOSIS — O09529 Supervision of elderly multigravida, unspecified trimester: Secondary | ICD-10-CM | POA: Insufficient documentation

## 2014-04-23 ENCOUNTER — Inpatient Hospital Stay (HOSPITAL_COMMUNITY): Payer: 59

## 2014-04-23 ENCOUNTER — Inpatient Hospital Stay (HOSPITAL_COMMUNITY)
Admission: AD | Admit: 2014-04-23 | Discharge: 2014-04-23 | Disposition: A | Discharge status: Home / Self Care | Payer: 59 | Source: Ambulatory Visit | Attending: Obstetrics & Gynecology | Admitting: Obstetrics & Gynecology

## 2014-04-23 ENCOUNTER — Encounter (HOSPITAL_COMMUNITY): Payer: Self-pay | Admitting: *Deleted

## 2014-04-23 DIAGNOSIS — N90810 Female genital mutilation status, unspecified: Secondary | ICD-10-CM | POA: Diagnosis not present

## 2014-04-23 DIAGNOSIS — O09522 Supervision of elderly multigravida, second trimester: Secondary | ICD-10-CM | POA: Diagnosis not present

## 2014-04-23 DIAGNOSIS — J069 Acute upper respiratory infection, unspecified: Secondary | ICD-10-CM | POA: Diagnosis not present

## 2014-04-23 DIAGNOSIS — Z3A21 21 weeks gestation of pregnancy: Secondary | ICD-10-CM | POA: Insufficient documentation

## 2014-04-23 DIAGNOSIS — R05 Cough: Secondary | ICD-10-CM | POA: Diagnosis present

## 2014-04-23 DIAGNOSIS — O10012 Pre-existing essential hypertension complicating pregnancy, second trimester: Secondary | ICD-10-CM | POA: Insufficient documentation

## 2014-04-23 DIAGNOSIS — R059 Cough, unspecified: Secondary | ICD-10-CM

## 2014-04-23 DIAGNOSIS — O3482 Maternal care for other abnormalities of pelvic organs, second trimester: Secondary | ICD-10-CM | POA: Insufficient documentation

## 2014-04-23 DIAGNOSIS — O9989 Other specified diseases and conditions complicating pregnancy, childbirth and the puerperium: Secondary | ICD-10-CM | POA: Insufficient documentation

## 2014-04-23 LAB — URINALYSIS, ROUTINE W REFLEX MICROSCOPIC
BILIRUBIN URINE: NEGATIVE
GLUCOSE, UA: NEGATIVE mg/dL
HGB URINE DIPSTICK: NEGATIVE
Ketones, ur: >80 mg/dL — AB
Leukocytes, UA: NEGATIVE
NITRITE: NEGATIVE
PH: 6 (ref 5.0–8.0)
Protein, ur: NEGATIVE mg/dL
Specific Gravity, Urine: 1.02 (ref 1.005–1.030)
Urobilinogen, UA: 0.2 mg/dL (ref 0.0–1.0)

## 2014-04-23 MED ORDER — HYDROCOD POLST-CHLORPHEN POLST 10-8 MG/5ML PO LQCR
5.0000 mL | Freq: Once | ORAL | Status: AC
  Administered 2014-04-23: 5 mL via ORAL
  Filled 2014-04-23: qty 5

## 2014-04-23 MED ORDER — HYDROCODONE-HOMATROPINE 5-1.5 MG/5ML PO SYRP: 5.0000 mL | ORAL_SOLUTION | Freq: Once | ORAL | Status: DC

## 2014-04-23 MED ORDER — HYDROCOD POLST-CHLORPHEN POLST 10-8 MG/5ML PO LQCR: 5.0000 mL | Freq: Once | ORAL | Status: DC

## 2014-04-23 NOTE — [facility] Note (Signed)
Dr. Loreta AveAcosta to read chest xray results. And call [facility] back.

## 2014-04-23 NOTE — Discharge Instructions (Signed)
Upper Respiratory Infection, Adult An upper respiratory infection (URI) is also known as the common cold. It is often caused by a type of germ (virus). Colds are easily spread (contagious). You can pass it to others by kissing, coughing, sneezing, or drinking out of the same glass. Usually, you get better in 1 or 2 weeks.  HOME CARE   Only take medicine as told by your doctor.  Use a warm mist humidifier or breathe in steam from a hot shower.  Drink enough water and fluids to keep your pee (urine) clear or pale yellow.  Get plenty of rest.  Return to work when your temperature is back to normal or as told by your doctor. You may use a face mask and wash your hands to stop your cold from spreading. GET HELP RIGHT AWAY IF:   After the first few days, you feel you are getting worse.  You have questions about your medicine.  You have chills, shortness of breath, or brown or red spit (mucus).  You have yellow or brown snot (nasal discharge) or pain in the face, especially when you bend forward.  You have a fever, puffy (swollen) neck, pain when you swallow, or white spots in the back of your throat.  You have a bad headache, ear pain, sinus pain, or chest pain.  You have a high-pitched whistling sound when you breathe in and out (wheezing).  You have a lasting cough or cough up blood.  You have sore muscles or a stiff neck. MAKE SURE YOU:   Understand these instructions.  Will watch your condition.  Will get help right away if you are not doing well or get worse. Document Released: 10/03/2007 Document Revised: 07/09/2011 Document Reviewed: 07/22/2013 [employer] Patient Information 2015 [employer]. This information is not intended to replace advice given to you by your health care provider. Make sure you discuss any questions you have with your health care provider.  

## 2014-04-23 NOTE — [facility] Note (Signed)
Pt states that cough medicine helped her cough.

## 2014-04-23 NOTE — MAU Provider Note (Signed)
  History     CSN: [id number]  Arrival date and time: 04/23/14 86570039   None     Chief Complaint  Patient presents with  . Cough  . Back Pain   HPI Patient is 38 y.o. [id number] 361w4d here with complaints of cough x 4 days, worsening.  Associated with subjective fevers and chills, tmax of 99.5 at home earlier today.  +FM, denies  VB, contractions   Past Medical History  Diagnosis Date  . Hypertension   . Female circumcision   . Labial abscess   . AMA (advanced maternal age) multigravida 35+     Past Surgical History  Procedure Laterality Date  . No past surgeries      Family History  Problem Relation Age of Onset  . Hypertension Mother   . Diabetes Mother   . Hypertension Brother   . Hypertension Sister     History  Substance Use Topics  . Smoking status: Never Smoker   . Smokeless tobacco: Never Used  . Alcohol Use: No    Allergies: No Known Allergies  Prescriptions prior to admission  Medication Sig Dispense Refill Last Dose  . dextromethorphan 15 MG/5ML syrup Take 10 mLs by mouth 4 (four) times daily as needed for cough (only started taking this 2 days ago).     Marland Kitchen. acetaminophen (TYLENOL) 500 MG tablet Take 500 mg by mouth every 6 (six) hours as needed for headache.   Not Taking  . famotidine (PEPCID) 20 MG tablet Take 1 tablet (20 mg total) by mouth 2 (two) times daily. 60 tablet 6   . prenatal vitamin w/FE, FA (PRENATAL 1 + 1) 27-1 MG TABS tablet Take 1 tablet by mouth daily at 12 noon. 30 each 12     Review of Systems  Constitutional: Negative for fever and chills.  Respiratory: Positive for cough. Negative for shortness of breath.   Cardiovascular: Negative for chest pain and leg swelling.  Gastrointestinal: Negative for heartburn, nausea, vomiting and diarrhea.  Genitourinary: Negative for dysuria, urgency, frequency and hematuria.  Neurological:       No headache   Physical Exam   Blood pressure 112/69, pulse 136, temperature 99 F (37.2 C),  temperature source Oral, resp. rate 20, height 5\' 6"  (1.676 m), weight 149 lb (67.586 kg), last menstrual period 11/23/2013, SpO2 100 %, currently breastfeeding.  Physical Exam  Constitutional: She is oriented to person, place, and time. She appears well-developed and well-nourished.  Nontoxic appearance but moderately ill-appearing   HENT:  Head: Normocephalic and atraumatic.  Eyes: Conjunctivae and EOM are normal.  Neck: Normal range of motion.  Cardiovascular: Normal rate and normal heart sounds.   Respiratory: Effort normal and breath sounds normal. No respiratory distress.  GI: Soft. She exhibits no distension. There is no tenderness.  Musculoskeletal: Normal range of motion. She exhibits no edema.  Neurological: She is alert and oriented to person, place, and time.  Skin: Skin is warm and dry. No erythema.    MAU Course  Procedures  MDM CXR 2 view: normal  Assessment and Plan  Patient is 38 y.o. [id number] 7061w4d reporting cough likely secondary to upper respiratory infection - rx tussionex 5mL prn cough, #16715mL - precautions discussed, pt to return for worsening symptoms/fevers/SOB   Dorette Hartel ROCIO 04/23/2014, 1:40 AM

## 2014-04-23 NOTE — MAU Note (Signed)
Pt presented to MAU with cough that started 4 days ago and back pain that began Thurs. Am. Pt denies leaking of fluid and bleeding.

## 2014-04-30 NOTE — L&D Delivery Note (Signed)
Delivery Note At 9:43 PM a viable and healthy female was delivered via Vaginal, Spontaneous Delivery (Presentation: Right Occiput Anterior).  APGAR: 9, 9; weight  .   Placenta status: Intact, Spontaneous.  Cord:  with the following complications: None.    Anesthesia: Epidural  Episiotomy:  Small cut into infibulation anteriorly Lacerations:  Abrasion right labial, not repaired.   Suture Repair: 4-0 Monocryl used to repair anterior defect and reapproximate infibulation  Catheter placed into urethra prior to repair Est. Blood Loss (mL):    Mom to postpartum.  Baby to Couplet care / Skin to Skin.  Wynelle BourgeoisWILLIAMS,Cipriana Biller 09/01/2014, 10:24 PM

## 2014-05-04 ENCOUNTER — Telehealth: Payer: Self-pay | Admitting: *Deleted

## 2014-05-04 NOTE — Telephone Encounter (Addendum)
Pt left message stating she has had a cough since 12/22 which is not going away. She wants to know what to do. Please call back. I returned pt's call and discussed her concern. She stated that the cough is slightly better than when she was seen @ MAU on 12/25. She has stopped taking the cough medicine because it makes her sleepy and does not help the cough. I advised pt that it takes a long time for a cough to get better. She may want to continue the cough medicine @ bedtime to help her sleep. She should go to MAU if she develops a fever of >100.4. She should call our office if the cough becomes worse. Pt voiced understanding.

## 2014-05-12 ENCOUNTER — Ambulatory Visit (INDEPENDENT_AMBULATORY_CARE_PROVIDER_SITE_OTHER): Payer: Self-pay | Admitting: Advanced Practice Midwife

## 2014-05-12 VITALS — BP 115/70 | HR 98 | Temp 97.9°F | Wt 150.4 lb

## 2014-05-12 DIAGNOSIS — Z3482 Encounter for supervision of other normal pregnancy, second trimester: Secondary | ICD-10-CM

## 2014-05-12 DIAGNOSIS — R05 Cough: Secondary | ICD-10-CM

## 2014-05-12 DIAGNOSIS — Z0489 Encounter for examination and observation for other specified reasons: Secondary | ICD-10-CM

## 2014-05-12 DIAGNOSIS — Z36 Encounter for antenatal screening of mother: Secondary | ICD-10-CM

## 2014-05-12 DIAGNOSIS — IMO0002 Reserved for concepts with insufficient information to code with codable children: Secondary | ICD-10-CM

## 2014-05-12 DIAGNOSIS — R058 Other specified cough: Secondary | ICD-10-CM

## 2014-05-12 LAB — POCT URINALYSIS DIP (DEVICE)
Bilirubin Urine: NEGATIVE
Glucose, UA: NEGATIVE mg/dL
Hgb urine dipstick: NEGATIVE
Ketones, ur: NEGATIVE mg/dL
Leukocytes, UA: NEGATIVE
Nitrite: NEGATIVE
Protein, ur: NEGATIVE mg/dL
Specific Gravity, Urine: 1.015 (ref 1.005–1.030)
Urobilinogen, UA: 0.2 mg/dL (ref 0.0–1.0)
pH: 6.5 (ref 5.0–8.0)

## 2014-05-12 MED ORDER — BENZONATATE 200 MG PO CAPS: 200.0000 mg | ORAL_CAPSULE | Freq: Three times a day (TID) | ORAL | Status: DC | PRN

## 2014-05-12 NOTE — Progress Notes (Signed)
C/O dry cough x 1 month since having a cold. No fever, chills, SOB. Lungs CTAB. Suspect post-viral cough. Low suspicion for PNA or Bronchitis. Tussionex not helping. Will switch to Tessalon Pearls. Rec Throat Coat Tea.

## 2014-05-12 NOTE — Progress Notes (Signed)
No complaints today.

## 2014-05-12 NOTE — Patient Instructions (Signed)
Throat Coat Tea or Peppermint tea.  Cough, Adult  A cough is a reflex that helps clear your throat and airways. It can help heal the body or may be a reaction to an irritated airway. A cough may only last 2 or 3 weeks (acute) or may last more than 8 weeks (chronic).  CAUSES Acute cough:  Viral or bacterial infections. Chronic cough:  Infections.  Allergies.  Asthma.  Post-nasal drip.  Smoking.  Heartburn or acid reflux.  Some medicines.  Chronic lung problems (COPD).  Cancer. SYMPTOMS   Cough.  Fever.  Chest pain.  Increased breathing rate.  High-pitched whistling sound when breathing (wheezing).  Colored mucus that you cough up (sputum). TREATMENT   A bacterial cough may be treated with antibiotic medicine.  A viral cough must run its course and will not respond to antibiotics.  Your caregiver may recommend other treatments if you have a chronic cough. HOME CARE INSTRUCTIONS   Only take over-the-counter or prescription medicines for pain, discomfort, or fever as directed by your caregiver. Use cough suppressants only as directed by your caregiver.  Use a cold steam vaporizer or humidifier in your bedroom or home to help loosen secretions.  Sleep in a semi-upright position if your cough is worse at night.  Rest as needed.  Stop smoking if you smoke. SEEK IMMEDIATE MEDICAL CARE IF:   You have pus in your sputum.  Your cough starts to worsen.  You cannot control your cough with suppressants and are losing sleep.  You begin coughing up blood.  You have difficulty breathing.  You develop pain which is getting worse or is uncontrolled with medicine.  You have a fever. MAKE SURE YOU:   Understand these instructions.  Will watch your condition.  Will get help right away if you are not doing well or get worse. Document Released: 10/13/2010 Document Revised: 07/09/2011 Document Reviewed: 10/13/2010 [facility] Patient Information 2015 [place],  [place]. This information is not intended to replace advice given to you by your health care provider. Make sure you discuss any questions you have with your health care provider.  Preterm Birth Preterm birth is a birth that happens before 37 weeks of pregnancy. Most pregnancies last about 39-41 weeks. Every week in the womb is important and is beneficial to the health of the infant. Infants born before 37 weeks of pregnancy are at a higher risk for complications. Depending on when the infant was born, he or she may be:  Late preterm. Born between 32 weeks and 37 weeks of pregnancy.  Very preterm. Born at less than 32 weeks of pregnancy.  Extremely preterm. Born at less than 25 weeks of pregnancy. The earlier a baby is born, the more likely the child will have issues related to prematurity. Complications and problems that can be seen in infants born too early include:  Problems breathing (respiratory distress syndrome).  Low birth weight.  Problems feeding.  Sleeping problems.  Yellowing of the skin (jaundice).  Infections such as pneumonia. Babies born very preterm or extremely preterm are at risk for more serious medical issues. These include:  More severe breathing issues.  Eyesight issues.  Brain development issues (intraventricular hemorrhage).  Behavioral and emotional development issues.  Growth and developmental delays.  Cerebral palsy.  Serious feeding or bowel complications (necrotizing enterocolitis). CAUSES  There are two broad categories of preterm birth.  Spontaneous preterm birth. This is a birth resulting from preterm labor (not medically induced) or preterm premature rupture of membranes (  PPROM).  Indicated preterm birth. This is a birth resulting from labor being medically induced due to health, personal, or social reasons. RISK FACTORS Preterm birth may be related to certain medical conditions, lifestyle factors, or demographic factors encountered by the  mother or fetus.  Medical conditions include:  Multiple gestations (twins, triplets, and so on).  Infection.  Diabetes.  Heart disease.  Kidney disease.  Cervical or uterine abnormalities.  Being underweight.  High blood pressure or preeclampsia.  Premature rupture of membranes (PROM).  Birth defects in the fetus.  Lifestyle factors include:  Poor prenatal care.  Poor nutrition or anemia.  Cigarette smoking.  Consuming alcohol.  High levels of stress and lack of social or emotional support.  Exposure to chemical or environmental toxins.  Substance abuse.  Demographic factors include:  African-American ethnicity.  Age (younger than 4618 or older than 39 years of age).  Low socioeconomic status. Women with a history of preterm labor or who become pregnant within 6018 months of giving birth are also at increased risk for preterm birth. DIAGNOSIS  Your health care provider may request additional tests to diagnose underlying complications resulting from preterm birth. Tests on the infant may include:  Physical exam.  Blood tests.  Chest X-rays.  Heart-lung monitoring. TREATMENT  After birth, special care will be taken to assess any problems or complications for the infant. Supportive care will be provided for the infant. Treatment depends on what problems are present and any complications that develop. Some preterm infants are cared for in a neonatal intensive care unit. In general, care may include:  Maintaining temperature and oxygen in a clear heated box (baby isolette).  Monitoring the infant's heart rate, breathing, and level of oxygen in the blood.  Monitoring for signs of infection and, if needed, giving IV antibiotic medicine.  Inserting a feeding tube (nose, mouth) or giving IV nutrition if unable to feed.  Inserting a breathing tube (ventilation).  Respiration support (continuous positive airway pressure [CPAP] or oxygen). Treatment will  change as the infant builds up strength and is able to breathe and eat on his or her own. For some infants, no special treatment is necessary. Parents may be educated on the potential health risks of prematurity to the infant. HOME CARE INSTRUCTIONS  Understand your infant's special conditions and needs. It may be reassuring to learn about infant CPR.  Monitor your infant in the car seat until he or she grows and matures. Infant car seats can cause breathing difficulties for preterm infants.  Keep your infant warm. Dress your infant in layers and keep him or her away from drafts, especially in cold months of the year.  Wash your hands thoroughly after going to the bathroom or changing a diaper. Late preterm infants may be more prone to infection.  Follow all your health care provider's instructions for providing support and care to your preterm infant.  Get support from organizations and groups that understand your challenges.  Follow up with your infant's health care provider as directed. Prevention There are some things you can do to help lower your risk of having a preterm infant in the future. These include:  Good prenatal care throughout the entire pregnancy. See a health care provider regularly for advice and tests.  Management of underlying medical conditions.  Proper self-care and lifestyle changes.  Proper diet and weight control.  Watching for signs of various infections. SEEK MEDICAL CARE IF:  Your infant has feeding difficulties.  Your infant has sleeping  difficulties.  Your infant has breathing difficulties.  Your infant's skin starts to look yellow.  Your infant shows signs of infection, such as a stuffy nose, fever, crying, or bluish color of the skin. FOR MORE INFORMATION March [employer]: www.marchofdimes.com Prematurity.org: www.prematurity.org Document Released: 07/07/2003 Document Revised: 02/04/2013 Document Reviewed: 11/13/2012 [facility] Patient  Information 2015 [place], [place]. This information is not intended to replace advice given to you by your health care provider. Make sure you discuss any questions you have with your health care provider.

## 2014-05-18 ENCOUNTER — Ambulatory Visit (HOSPITAL_COMMUNITY)
Admission: RE | Admit: 2014-05-18 | Discharge: 2014-05-18 | Disposition: A | Discharge status: Home / Self Care | Payer: Medicaid Other | Source: Ambulatory Visit | Attending: Advanced Practice Midwife | Admitting: Advanced Practice Midwife

## 2014-05-18 DIAGNOSIS — Z0489 Encounter for examination and observation for other specified reasons: Secondary | ICD-10-CM | POA: Insufficient documentation

## 2014-05-18 DIAGNOSIS — O09529 Supervision of elderly multigravida, unspecified trimester: Secondary | ICD-10-CM | POA: Insufficient documentation

## 2014-05-18 DIAGNOSIS — O09522 Supervision of elderly multigravida, second trimester: Secondary | ICD-10-CM | POA: Insufficient documentation

## 2014-05-18 DIAGNOSIS — Z36 Encounter for antenatal screening of mother: Secondary | ICD-10-CM | POA: Diagnosis present

## 2014-05-18 DIAGNOSIS — O0932 Supervision of pregnancy with insufficient antenatal care, second trimester: Secondary | ICD-10-CM | POA: Insufficient documentation

## 2014-05-18 DIAGNOSIS — Z3A24 24 weeks gestation of pregnancy: Secondary | ICD-10-CM | POA: Diagnosis not present

## 2014-05-18 DIAGNOSIS — IMO0002 Reserved for concepts with insufficient information to code with codable children: Secondary | ICD-10-CM | POA: Insufficient documentation

## 2014-05-19 ENCOUNTER — Encounter: Payer: Self-pay | Admitting: Advanced Practice Midwife

## 2014-06-09 ENCOUNTER — Ambulatory Visit (INDEPENDENT_AMBULATORY_CARE_PROVIDER_SITE_OTHER): Payer: Medicaid Other | Admitting: Physician Assistant

## 2014-06-09 VITALS — BP 104/70 | HR 109 | Temp 98.0°F | Wt 151.7 lb

## 2014-06-09 DIAGNOSIS — Z3482 Encounter for supervision of other normal pregnancy, second trimester: Secondary | ICD-10-CM

## 2014-06-09 DIAGNOSIS — Z3492 Encounter for supervision of normal pregnancy, unspecified, second trimester: Secondary | ICD-10-CM

## 2014-06-09 DIAGNOSIS — Z3493 Encounter for supervision of normal pregnancy, unspecified, third trimester: Secondary | ICD-10-CM

## 2014-06-09 DIAGNOSIS — Z23 Encounter for immunization: Secondary | ICD-10-CM

## 2014-06-09 DIAGNOSIS — O09523 Supervision of elderly multigravida, third trimester: Secondary | ICD-10-CM

## 2014-06-09 LAB — CBC
HEMATOCRIT: 29.8 % — AB (ref 36.0–46.0)
HEMOGLOBIN: 9.4 g/dL — AB (ref 12.0–15.0)
MCH: 23 pg — ABNORMAL LOW (ref 26.0–34.0)
MCHC: 31.5 g/dL (ref 30.0–36.0)
MCV: 72.9 fL — ABNORMAL LOW (ref 78.0–100.0)
MPV: 9 fL (ref 8.6–12.4)
Platelets: 370 10*3/uL (ref 150–400)
RBC: 4.09 MIL/uL (ref 3.87–5.11)
RDW: 15.3 % (ref 11.5–15.5)
WBC: 7.5 10*3/uL (ref 4.0–10.5)

## 2014-06-09 LAB — POCT URINALYSIS DIP (DEVICE)
BILIRUBIN URINE: NEGATIVE
GLUCOSE, UA: 100 mg/dL — AB
Hgb urine dipstick: NEGATIVE
Ketones, ur: NEGATIVE mg/dL
Leukocytes, UA: NEGATIVE
NITRITE: NEGATIVE
PH: 6 (ref 5.0–8.0)
Protein, ur: NEGATIVE mg/dL
Specific Gravity, Urine: >=1.03 (ref 1.005–1.030)
Urobilinogen, UA: 0.2 mg/dL (ref 0.0–1.0)

## 2014-06-09 MED ORDER — TETANUS-DIPHTH-ACELL PERTUSSIS 5-2.5-18.5 LF-MCG/0.5 IM SUSP
0.5000 mL | Freq: Once | INTRAMUSCULAR | Status: AC
  Administered 2014-06-09: 0.5 mL via INTRAMUSCULAR

## 2014-06-09 NOTE — Progress Notes (Signed)
[id number]. No complaints or concerns today. No contractions just intermittent low pelvic pain that last about 5 minutes. No LOF, no excessive discharge, no bleeding. + fetal movement.   See flowsheet for details.  Patient amendable to Tdap today. Hx of GDM; 1hr gtt performed today. Routine 28wk labs collected. Discussed preterm labor Patient scheduled for f/u US for growth in AMA. RTC in 2 weeks.   Debra AdaJazma Ankush Gintz, DO 06/09/2014, 11:09 AM PGY-1, [facility]

## 2014-06-09 NOTE — Patient Instructions (Signed)
Third Trimester of Pregnancy The third trimester is from week 29 through week 42, months 7 through 9. The third trimester is a time when the fetus is growing rapidly. At the end of the ninth month, the fetus is about 20 inches in length and weighs 6-10 pounds.  BODY CHANGES Your body goes through many changes during pregnancy. The changes vary from woman to woman.   Your weight will continue to increase. You can expect to gain 25-35 pounds (11-16 kg) by the end of the pregnancy.  You may begin to get stretch marks on your hips, abdomen, and breasts.  You may urinate more often because the fetus is moving lower into your pelvis and pressing on your bladder.  You may develop or continue to have heartburn as a result of your pregnancy.  You may develop constipation because certain hormones are causing the muscles that push waste through your intestines to slow down.  You may develop hemorrhoids or swollen, bulging veins (varicose veins).  You may have pelvic pain because of the weight gain and pregnancy hormones relaxing your joints between the bones in your pelvis. Backaches may result from overexertion of the muscles supporting your posture.  You may have changes in your hair. These can include thickening of your hair, rapid growth, and changes in texture. Some women also have hair loss during or after pregnancy, or hair that feels dry or thin. Your hair will most likely return to normal after your baby is born.  Your breasts will continue to grow and be tender. A yellow discharge may leak from your breasts called colostrum.  Your belly button may stick out.  You may feel short of breath because of your expanding uterus.  You may notice the fetus "dropping," or moving lower in your abdomen.  You may have a bloody mucus discharge. This usually occurs a few days to a week before labor begins.  Your cervix becomes thin and soft (effaced) near your due date. WHAT TO EXPECT AT YOUR PRENATAL  EXAMS  You will have prenatal exams every 2 weeks until week 36. Then, you will have weekly prenatal exams. During a routine prenatal visit:  You will be weighed to make sure you and the fetus are growing normally.  Your blood pressure is taken.  Your abdomen will be measured to track your baby's growth.  The fetal heartbeat will be listened to.  Any test results from the previous visit will be discussed.  You may have a cervical check near your due date to see if you have effaced. At around 36 weeks, your caregiver will check your cervix. At the same time, your caregiver will also perform a test on the secretions of the vaginal tissue. This test is to determine if a type of bacteria, Group B streptococcus, is present. Your caregiver will explain this further. Your caregiver may ask you:  What your birth plan is.  How you are feeling.  If you are feeling the baby move.  If you have had any abnormal symptoms, such as leaking fluid, bleeding, severe headaches, or abdominal cramping.  If you have any questions. Other tests or screenings that may be performed during your third trimester include:  Blood tests that check for low iron levels (anemia).  Fetal testing to check the health, activity level, and growth of the fetus. Testing is done if you have certain medical conditions or if there are problems during the pregnancy. FALSE LABOR You may feel small, irregular contractions that   eventually go away. These are called Braxton Hicks contractions, or false labor. Contractions may last for hours, days, or even weeks before true labor sets in. If contractions come at regular intervals, intensify, or become painful, it is best to be seen by your caregiver.  SIGNS OF LABOR   Menstrual-like cramps.  Contractions that are 5 minutes apart or less.  Contractions that start on the top of the uterus and spread down to the lower abdomen and back.  A sense of increased pelvic pressure or back  pain.  A watery or bloody mucus discharge that comes from the vagina. If you have any of these signs before the 37th week of pregnancy, call your caregiver right away. You need to go to the hospital to get checked immediately. HOME CARE INSTRUCTIONS   Avoid all smoking, herbs, alcohol, and unprescribed drugs. These chemicals affect the formation and growth of the baby.  Follow your caregiver's instructions regarding medicine use. There are medicines that are either safe or unsafe to take during pregnancy.  Exercise only as directed by your caregiver. Experiencing uterine cramps is a good sign to stop exercising.  Continue to eat regular, healthy meals.  Wear a good support bra for breast tenderness.  Do not use hot tubs, steam rooms, or saunas.  Wear your seat belt at all times when driving.  Avoid raw meat, uncooked cheese, cat litter boxes, and soil used by cats. These carry germs that can cause birth defects in the baby.  Take your prenatal vitamins.  Try taking a stool softener (if your caregiver approves) if you develop constipation. Eat more high-fiber foods, such as fresh vegetables or fruit and whole grains. Drink plenty of fluids to keep your urine clear or pale yellow.  Take warm sitz baths to soothe any pain or discomfort caused by hemorrhoids. Use hemorrhoid cream if your caregiver approves.  If you develop varicose veins, wear support hose. Elevate your feet for 15 minutes, 3-4 times a day. Limit salt in your diet.  Avoid heavy lifting, wear low heal shoes, and practice good posture.  Rest a lot with your legs elevated if you have leg cramps or low back pain.  Visit your dentist if you have not gone during your pregnancy. Use a soft toothbrush to brush your teeth and be gentle when you floss.  A sexual relationship may be continued unless your caregiver directs you otherwise.  Do not travel far distances unless it is absolutely necessary and only with the approval  of your caregiver.  Take prenatal classes to understand, practice, and ask questions about the labor and delivery.  Make a trial run to the hospital.  Pack your hospital bag.  Prepare the baby's nursery.  Continue to go to all your prenatal visits as directed by your caregiver. SEEK MEDICAL CARE IF:  You are unsure if you are in labor or if your water has broken.  You have dizziness.  You have mild pelvic cramps, pelvic pressure, or nagging pain in your abdominal area.  You have persistent nausea, vomiting, or diarrhea.  You have a bad smelling vaginal discharge.  You have pain with urination. SEEK IMMEDIATE MEDICAL CARE IF:   You have a fever.  You are leaking fluid from your vagina.  You have spotting or bleeding from your vagina.  You have severe abdominal cramping or pain.  You have rapid weight loss or gain.  You have shortness of breath with chest pain.  You notice sudden or extreme swelling   of your face, hands, ankles, feet, or legs.  You have not felt your baby move in over an hour.  You have severe headaches that do not go away with medicine.  You have vision changes. Document Released: 04/10/2001 Document Revised: 04/21/2013 Document Reviewed: 06/17/2012 [employer] Patient Information 2015 [employer]. This information is not intended to replace advice given to you by your health care provider. Make sure you discuss any questions you have with your health care provider.  

## 2014-06-09 NOTE — Progress Notes (Signed)
C/o of occasional pelvic pressure.  1hr gtt and 28 weeks labs today.  tdap today.

## 2014-06-09 NOTE — Progress Notes (Signed)
I have seen and evaluated the patient with the DO resident. I agree with the assessment and plan as written above.  28 week labs pending.  Bertram DenverKaren E Teague Clark, PA-C  06/09/2014 11:56 AM

## 2014-06-10 LAB — RPR

## 2014-06-10 LAB — GLUCOSE TOLERANCE, 1 HOUR (50G) W/O FASTING: GLUCOSE 1 HOUR GTT: 153 mg/dL — AB (ref 70–140)

## 2014-06-10 LAB — HIV ANTIBODY (ROUTINE TESTING W REFLEX): HIV 1&2 Ab, 4th Generation: NONREACTIVE

## 2014-06-11 ENCOUNTER — Telehealth: Payer: Self-pay

## 2014-06-11 NOTE — Telephone Encounter (Signed)
Attempted to contact patient with pacific interpreter [phone] [id number]. Called mobile number-- no answer-- left message stating we are calling with results, please call clinic. Called home phone and left same message.

## 2014-06-11 NOTE — Telephone Encounter (Signed)
-----   Message from Bertram DenverKaren E Teague Clark, PA-C sent at 06/10/2014 10:08 PM EST ----- Please call pt and schedule 3 hour GTT asap. Repeat CBC at that time as well.   Thanks, KTC

## 2014-06-15 NOTE — Telephone Encounter (Signed)
Patient called back stating she missed our call on Friday and is trying to reach us for her results. Called patient and informed her of results and recommendations. Patient verbalized understanding and states she can come Friday at 8 2/19. Patient aware to come in fasting.

## 2014-06-18 ENCOUNTER — Other Ambulatory Visit: Payer: Medicaid Other

## 2014-06-18 DIAGNOSIS — O09529 Supervision of elderly multigravida, unspecified trimester: Secondary | ICD-10-CM

## 2014-06-18 LAB — CBC
HEMATOCRIT: 31.8 % — AB (ref 36.0–46.0)
Hemoglobin: 9.9 g/dL — ABNORMAL LOW (ref 12.0–15.0)
MCH: 23.2 pg — AB (ref 26.0–34.0)
MCHC: 31.1 g/dL (ref 30.0–36.0)
MCV: 74.5 fL — AB (ref 78.0–100.0)
MPV: 8.6 fL (ref 8.6–12.4)
Platelets: 324 10*3/uL (ref 150–400)
RBC: 4.27 MIL/uL (ref 3.87–5.11)
RDW: 16.5 % — AB (ref 11.5–15.5)
WBC: 8.3 10*3/uL (ref 4.0–10.5)

## 2014-06-19 LAB — GLUCOSE TOLERANCE, 3 HOURS
Glucose Tolerance, 1 hour: 194 mg/dL — ABNORMAL HIGH (ref 70–189)
Glucose Tolerance, 2 hour: 171 mg/dL — ABNORMAL HIGH (ref 70–164)
Glucose Tolerance, Fasting: 90 mg/dL (ref 70–104)
Glucose, GTT - 3 Hour: 154 mg/dL — ABNORMAL HIGH (ref 70–144)

## 2014-06-21 ENCOUNTER — Encounter: Payer: Self-pay | Admitting: Obstetrics & Gynecology

## 2014-06-21 DIAGNOSIS — O24419 Gestational diabetes mellitus in pregnancy, unspecified control: Secondary | ICD-10-CM | POA: Insufficient documentation

## 2014-06-22 ENCOUNTER — Encounter: Payer: Self-pay | Admitting: *Deleted

## 2014-06-23 ENCOUNTER — Ambulatory Visit (INDEPENDENT_AMBULATORY_CARE_PROVIDER_SITE_OTHER): Payer: Medicaid Other | Admitting: Physician Assistant

## 2014-06-23 VITALS — BP 111/68 | HR 97 | Temp 98.2°F | Wt 154.6 lb

## 2014-06-23 DIAGNOSIS — O24419 Gestational diabetes mellitus in pregnancy, unspecified control: Secondary | ICD-10-CM

## 2014-06-23 DIAGNOSIS — O0993 Supervision of high risk pregnancy, unspecified, third trimester: Secondary | ICD-10-CM

## 2014-06-23 LAB — POCT URINALYSIS DIP (DEVICE)
Bilirubin Urine: NEGATIVE
Glucose, UA: NEGATIVE mg/dL
Ketones, ur: NEGATIVE mg/dL
Nitrite: NEGATIVE
Protein, ur: NEGATIVE mg/dL
SPECIFIC GRAVITY, URINE: 1.01 (ref 1.005–1.030)
UROBILINOGEN UA: 0.2 mg/dL (ref 0.0–1.0)
pH: 7 (ref 5.0–8.0)

## 2014-06-23 NOTE — Progress Notes (Signed)
29 weeks,  C/O heartburn  Endorses good fetal movement.  Denies LOF, vaginal bleeding, dysuria.  OTC med sheet given = advise tums - if no benefit =zantac Failed 3 hour gtt on 2/19.  Will need HRC/diabetic education.

## 2014-06-23 NOTE — Patient Instructions (Signed)
Gestational Diabetes Mellitus Gestational diabetes mellitus, often simply referred to as gestational diabetes, is a type of diabetes that some women develop during pregnancy. In gestational diabetes, the pancreas does not make enough insulin (a hormone), the cells are less responsive to the insulin that is made (insulin resistance), or both.Normally, insulin moves sugars from food into the tissue cells. The tissue cells use the sugars for energy. The lack of insulin or the lack of normal response to insulin causes excess sugars to build up in the blood instead of going into the tissue cells. As a result, high blood sugar (hyperglycemia) develops. The effect of high sugar (glucose) levels can cause many problems.  RISK FACTORS You have an increased chance of developing gestational diabetes if you have a family history of diabetes and also have one or more of the following risk factors:  A body mass index over 30 (obesity).  A previous pregnancy with gestational diabetes.  An older age at the time of pregnancy. If blood glucose levels are kept in the normal range during pregnancy, women can have a healthy pregnancy. If your blood glucose levels are not well controlled, there may be risks to you, your unborn baby (fetus), your labor and delivery, or your newborn baby.  SYMPTOMS  If symptoms are experienced, they are much like symptoms you would normally expect during pregnancy. The symptoms of gestational diabetes include:   Increased thirst (polydipsia).  Increased urination (polyuria).  Increased urination during the night (nocturia).  Weight loss. This weight loss may be rapid.  Frequent, recurring infections.  Tiredness (fatigue).  Weakness.  Vision changes, such as blurred vision.  Fruity smell to your breath.  Abdominal pain. DIAGNOSIS Diabetes is diagnosed when blood glucose levels are increased. Your blood glucose level may be checked by one or more of the following blood  tests:  A fasting blood glucose test. You will not be allowed to eat for at least 8 hours before a blood sample is taken.  A random blood glucose test. Your blood glucose is checked at any time of the day regardless of when you ate.  A hemoglobin A1c blood glucose test. A hemoglobin A1c test provides information about blood glucose control over the previous 3 months.  An oral glucose tolerance test (OGTT). Your blood glucose is measured after you have not eaten (fasted) for 1-3 hours and then after you drink a glucose-containing beverage. Since the hormones that cause insulin resistance are highest at about 24-28 weeks of a pregnancy, an OGTT is usually performed during that time. If you have risk factors for gestational diabetes, your health care provider may test you for gestational diabetes earlier than 24 weeks of pregnancy. TREATMENT   You will need to take diabetes medicine or insulin daily to keep blood glucose levels in the desired range.  You will need to match insulin dosing with exercise and healthy food choices. The treatment goal is to maintain the before-meal (preprandial), bedtime, and overnight blood glucose level at 60-99 mg/dL during pregnancy. The treatment goal is to further maintain peak after-meal blood sugar (postprandial glucose) level at 100-140 mg/dL. HOME CARE INSTRUCTIONS   Have your hemoglobin A1c level checked twice a year.  Perform daily blood glucose monitoring as directed by your health care provider. It is common to perform frequent blood glucose monitoring.  Monitor urine ketones when you are ill and as directed by your health care provider.  Take your diabetes medicine and insulin as directed by your health care provider   to maintain your blood glucose level in the desired range.  Never run out of diabetes medicine or insulin. It is needed every day.  Adjust insulin based on your intake of carbohydrates. Carbohydrates can raise blood glucose levels but  need to be included in your diet. Carbohydrates provide vitamins, minerals, and fiber which are an essential part of a healthy diet. Carbohydrates are found in fruits, vegetables, whole grains, dairy products, legumes, and foods containing added sugars.  Eat healthy foods. Alternate 3 meals with 3 snacks.  Maintain a healthy weight gain. The usual total expected weight gain varies according to your prepregnancy body mass index (BMI).  Carry a medical alert card or wear your medical alert jewelry.  Carry a 15-gram carbohydrate snack with you at all times to treat low blood glucose (hypoglycemia). Some examples of 15-gram carbohydrate snacks include:  Glucose tablets, 3 or 4.  Glucose gel, 15-gram tube.  Raisins, 2 tablespoons (24 g).  Jelly beans, 6.  Animal crackers, 8.  Fruit juice, regular soda, or low-fat milk, 4 ounces (120 mL).  Gummy treats, 9.  Recognize hypoglycemia. Hypoglycemia during pregnancy occurs with blood glucose levels of 60 mg/dL and below. The risk for hypoglycemia increases when fasting or skipping meals, during or after intense exercise, and during sleep. Hypoglycemia symptoms can include:  Tremors or shakes.  Decreased ability to concentrate.  Sweating.  Increased heart rate.  Headache.  Dry mouth.  Hunger.  Irritability.  Anxiety.  Restless sleep.  Altered speech or coordination.  Confusion.  Treat hypoglycemia promptly. If you are alert and able to safely swallow, follow the 15:15 rule:  Take 15-20 grams of rapid-acting glucose or carbohydrate. Rapid-acting options include glucose gel, glucose tablets, or 4 ounces (120 mL) of fruit juice, regular soda, or low-fat milk.  Check your blood glucose level 15 minutes after taking the glucose.  Take 15-20 grams more of glucose if the repeat blood glucose level is still 70 mg/dL or below.  Eat a meal or snack within 1 hour once blood glucose levels return to normal.  Be alert to polyuria  (excess urination) and polydipsia (excess thirst) which are early signs of hyperglycemia. An early awareness of hyperglycemia allows for prompt treatment. Treat hyperglycemia as directed by your health care provider.  Engage in at least 30 minutes of physical activity a day or as directed by your health care provider. Ten minutes of physical activity timed 30 minutes after each meal is encouraged to control postprandial blood glucose levels.  Adjust your insulin dosing and food intake as needed if you start a new exercise or sport.  Follow your sick-day plan at any time you are unable to eat or drink as usual.  Avoid tobacco and alcohol use.  Keep all follow-up visits as directed by your health care provider.  Follow the advice of your health care provider regarding your prenatal and post-delivery (postpartum) appointments, meal planning, exercise, medicines, vitamins, blood tests, other medical tests, and physical activities.  Perform daily skin and foot care. Examine your skin and feet daily for cuts, bruises, redness, nail problems, bleeding, blisters, or sores.  Brush your teeth and gums at least twice a day and floss at least once a day. Follow up with your dentist regularly.  Schedule an eye exam during the first trimester of your pregnancy or as directed by your health care provider.  Share your diabetes management plan with your workplace or school.  Stay up-to-date with immunizations.  Learn to manage stress.    Obtain ongoing diabetes education and support as needed.  Learn about and consider breastfeeding your baby.  You should have your blood sugar level checked 6-12 weeks after delivery. This is done with an oral glucose tolerance test (OGTT). SEEK MEDICAL CARE IF:   You are unable to eat food or drink fluids for more than 6 hours.  You have nausea and vomiting for more than 6 hours.  You have a blood glucose level of 200 mg/dL and you have ketones in your  urine.  There is a change in mental status.  You develop vision problems.  You have a persistent headache.  You have upper abdominal pain or discomfort.  You develop an additional serious illness.  You have diarrhea for more than 6 hours.  You have been sick or have had a fever for a couple of days and are not getting better. SEEK IMMEDIATE MEDICAL CARE IF:   You have difficulty breathing.  You no longer feel the baby moving.  You are bleeding or have discharge from your vagina.  You start having premature contractions or labor. MAKE SURE YOU:  Understand these instructions.  Will watch your condition.  Will get help right away if you are not doing well or get worse. Document Released: 07/23/2000 Document Revised: 08/31/2013 Document Reviewed: 11/13/2011 Exit[employer] Patient Information 2015 [employer]. This information is not intended to replace advice given to you by your health care provider. Make sure you discuss any questions you have with your health care provider.  

## 2014-06-25 ENCOUNTER — Encounter: Payer: Self-pay | Admitting: *Deleted

## 2014-06-28 ENCOUNTER — Ambulatory Visit: Payer: Medicaid Other | Admitting: *Deleted

## 2014-06-28 ENCOUNTER — Encounter: Payer: Medicaid Other | Attending: Obstetrics & Gynecology | Admitting: *Deleted

## 2014-06-28 VITALS — Ht 67.25 in | Wt 153.7 lb

## 2014-06-28 DIAGNOSIS — O24419 Gestational diabetes mellitus in pregnancy, unspecified control: Secondary | ICD-10-CM

## 2014-06-28 DIAGNOSIS — Z713 Dietary counseling and surveillance: Secondary | ICD-10-CM | POA: Diagnosis not present

## 2014-06-28 MED ORDER — ACCU-CHEK FASTCLIX LANCETS MISC: 1.0000 | Freq: Four times a day (QID) | Status: DC

## 2014-06-28 MED ORDER — GLUCOSE BLOOD VI STRP: ORAL_STRIP | Status: DC

## 2014-06-28 NOTE — Progress Notes (Signed)
Nutrition note: GDM diet education Pt is a newly diagnosed GDM pt; pt reports she had GDM with her last pregnancy. Pt has gained 9.7# @ 5440w6d, which is < expected. Pt reports eating 2 meals & 3 snacks/d (no breakfast). Pt is taking a PNV. Pt reports no N/V but has heartburn occ. NKFA. Pt reports no physical activity. Pt received verbal & written education on GDM diet. Encouraged pt to talk with her doctor about walking during this pregnancy. Discussed wt gain goals of 25-35# or 1#/wk. Pt agrees to follow GDM diet with 3 meals & 3 snacks/d with proper CHO/ protein combination. Pt has WIC & plans to BF. F/u in 2-4 wks Blondell RevealLaura Christobal Morado, MS, RD, LDN

## 2014-06-28 NOTE — Progress Notes (Signed)
  Patient was seen on 06/28/14 for Gestational Diabetes self-management . The following learning objectives were met by the patient :   States the definition of Gestational Diabetes  States when to check blood glucose levels  Demonstrates proper blood glucose monitoring techniques  States the effect of stress and exercise on blood glucose levels  States the importance of limiting caffeine and abstaining from alcohol and smoking  Plan:  Consider  increasing your activity level by walking daily as tolerated Begin checking BG before breakfast and 1-2 hours after first bit of breakfast, lunch and dinner after  as directed by MD  Take medication  as directed by MD  Blood glucose monitor given: Accu Chek Nano BG Monitoring Kit Lot # [id number] Exp: 04/30/15  Patient instructed to monitor glucose levels: FBS: 60 - <90 2 hour: <120  Patient received the following handouts:  Nutrition Diabetes and Pregnancy   Patient will be seen for follow-up as needed.

## 2014-06-30 ENCOUNTER — Ambulatory Visit (HOSPITAL_COMMUNITY)
Admission: RE | Admit: 2014-06-30 | Discharge: 2014-06-30 | Disposition: A | Discharge status: Home / Self Care | Payer: Medicaid Other | Source: Ambulatory Visit | Attending: Physician Assistant | Admitting: Physician Assistant

## 2014-06-30 ENCOUNTER — Other Ambulatory Visit: Payer: Self-pay | Admitting: Physician Assistant

## 2014-06-30 DIAGNOSIS — O0933 Supervision of pregnancy with insufficient antenatal care, third trimester: Secondary | ICD-10-CM | POA: Diagnosis not present

## 2014-06-30 DIAGNOSIS — O24410 Gestational diabetes mellitus in pregnancy, diet controlled: Secondary | ICD-10-CM | POA: Diagnosis not present

## 2014-06-30 DIAGNOSIS — O24419 Gestational diabetes mellitus in pregnancy, unspecified control: Secondary | ICD-10-CM

## 2014-06-30 DIAGNOSIS — O09529 Supervision of elderly multigravida, unspecified trimester: Secondary | ICD-10-CM

## 2014-06-30 DIAGNOSIS — Z3A30 30 weeks gestation of pregnancy: Secondary | ICD-10-CM | POA: Insufficient documentation

## 2014-06-30 DIAGNOSIS — O09523 Supervision of elderly multigravida, third trimester: Secondary | ICD-10-CM | POA: Insufficient documentation

## 2014-06-30 DIAGNOSIS — Z3493 Encounter for supervision of normal pregnancy, unspecified, third trimester: Secondary | ICD-10-CM

## 2014-07-07 ENCOUNTER — Ambulatory Visit (INDEPENDENT_AMBULATORY_CARE_PROVIDER_SITE_OTHER): Payer: Medicaid Other | Admitting: Family

## 2014-07-07 VITALS — BP 123/82 | HR 107 | Temp 98.3°F | Wt 155.3 lb

## 2014-07-07 DIAGNOSIS — O24419 Gestational diabetes mellitus in pregnancy, unspecified control: Secondary | ICD-10-CM

## 2014-07-07 LAB — POCT URINALYSIS DIP (DEVICE)
Bilirubin Urine: NEGATIVE
GLUCOSE, UA: NEGATIVE mg/dL
Hgb urine dipstick: NEGATIVE
KETONES UR: NEGATIVE mg/dL
LEUKOCYTES UA: NEGATIVE
NITRITE: NEGATIVE
PH: 7 (ref 5.0–8.0)
PROTEIN: 30 mg/dL — AB
Specific Gravity, Urine: 1.025 (ref 1.005–1.030)
UROBILINOGEN UA: 0.2 mg/dL (ref 0.0–1.0)

## 2014-07-07 NOTE — Progress Notes (Signed)
C/o intermittent pelvic pressure.  Has been checking sugars since saw Harriett Sineancy-- will need high risk clinic appointment.

## 2014-07-07 NOTE — Progress Notes (Signed)
Reports checking blood glucose for the past week. FBS 81-106 (7/9 hi), B 78-112, L 83-139 (1/8) D 81-148 (2/8 hi).  Pt not eating snack before bedtime.  Discussed eating lean protein/whole wheat bread before bedtime to help stabilize glucose.  Plan to return on Thursday for appt in [facility].  Intermittent pelvic pressure, no contractions or vaginal bleeding.

## 2014-07-15 ENCOUNTER — Ambulatory Visit (INDEPENDENT_AMBULATORY_CARE_PROVIDER_SITE_OTHER): Payer: Medicaid Other | Admitting: Family

## 2014-07-15 VITALS — BP 120/73 | HR 98 | Temp 97.7°F | Wt 156.2 lb

## 2014-07-15 DIAGNOSIS — Z3493 Encounter for supervision of normal pregnancy, unspecified, third trimester: Secondary | ICD-10-CM

## 2014-07-15 LAB — POCT URINALYSIS DIP (DEVICE)
BILIRUBIN URINE: NEGATIVE
Glucose, UA: NEGATIVE mg/dL
Hgb urine dipstick: NEGATIVE
Ketones, ur: 15 mg/dL — AB
Leukocytes, UA: NEGATIVE
Nitrite: NEGATIVE
PH: 5.5 (ref 5.0–8.0)
Protein, ur: 30 mg/dL — AB
UROBILINOGEN UA: 0.2 mg/dL (ref 0.0–1.0)

## 2014-07-15 NOTE — Progress Notes (Signed)
No concerns today 

## 2014-07-15 NOTE — Progress Notes (Signed)
FBS 82-106 (1 value over 100)  B 80-87 L 84-114 D 81-118.  Did not try the snack.  Doing well overall.  Long night, 823 yo daughter sick.

## 2014-07-22 ENCOUNTER — Ambulatory Visit (INDEPENDENT_AMBULATORY_CARE_PROVIDER_SITE_OTHER): Payer: Medicaid Other | Admitting: Obstetrics & Gynecology

## 2014-07-22 VITALS — BP 110/69 | HR 92 | Temp 98.3°F | Wt 156.9 lb

## 2014-07-22 DIAGNOSIS — O24419 Gestational diabetes mellitus in pregnancy, unspecified control: Secondary | ICD-10-CM

## 2014-07-22 DIAGNOSIS — Z3493 Encounter for supervision of normal pregnancy, unspecified, third trimester: Secondary | ICD-10-CM

## 2014-07-22 LAB — POCT URINALYSIS DIP (DEVICE)
BILIRUBIN URINE: NEGATIVE
Glucose, UA: NEGATIVE mg/dL
HGB URINE DIPSTICK: NEGATIVE
KETONES UR: NEGATIVE mg/dL
Leukocytes, UA: NEGATIVE
Nitrite: NEGATIVE
PH: 6 (ref 5.0–8.0)
Protein, ur: NEGATIVE mg/dL
Specific Gravity, Urine: 1.015 (ref 1.005–1.030)
Urobilinogen, UA: 0.2 mg/dL (ref 0.0–1.0)

## 2014-07-22 NOTE — Progress Notes (Signed)
C/o 2 uc's yesterday.Discussed preterm labor precautions.

## 2014-07-22 NOTE — Progress Notes (Signed)
FBS and pp are in range. Good FM

## 2014-07-22 NOTE — Patient Instructions (Signed)
Third Trimester of Pregnancy The third trimester is from week 29 through week 42, months 7 through 9. The third trimester is a time when the fetus is growing rapidly. At the end of the ninth month, the fetus is about 20 inches in length and weighs 6-10 pounds.  BODY CHANGES Your body goes through many changes during pregnancy. The changes vary from woman to woman.   Your weight will continue to increase. You can expect to gain 25-35 pounds (11-16 kg) by the end of the pregnancy.  You may begin to get stretch marks on your hips, abdomen, and breasts.  You may urinate more often because the fetus is moving lower into your pelvis and pressing on your bladder.  You may develop or continue to have heartburn as a result of your pregnancy.  You may develop constipation because certain hormones are causing the muscles that push waste through your intestines to slow down.  You may develop hemorrhoids or swollen, bulging veins (varicose veins).  You may have pelvic pain because of the weight gain and pregnancy hormones relaxing your joints between the bones in your pelvis. Backaches may result from overexertion of the muscles supporting your posture.  You may have changes in your hair. These can include thickening of your hair, rapid growth, and changes in texture. Some women also have hair loss during or after pregnancy, or hair that feels dry or thin. Your hair will most likely return to normal after your baby is born.  Your breasts will continue to grow and be tender. A yellow discharge may leak from your breasts called colostrum.  Your belly button may stick out.  You may feel short of breath because of your expanding uterus.  You may notice the fetus "dropping," or moving lower in your abdomen.  You may have a bloody mucus discharge. This usually occurs a few days to a week before labor begins.  Your cervix becomes thin and soft (effaced) near your due date. WHAT TO EXPECT AT YOUR PRENATAL  EXAMS  You will have prenatal exams every 2 weeks until week 36. Then, you will have weekly prenatal exams. During a routine prenatal visit:  You will be weighed to make sure you and the fetus are growing normally.  Your blood pressure is taken.  Your abdomen will be measured to track your baby's growth.  The fetal heartbeat will be listened to.  Any test results from the previous visit will be discussed.  You may have a cervical check near your due date to see if you have effaced. At around 36 weeks, your caregiver will check your cervix. At the same time, your caregiver will also perform a test on the secretions of the vaginal tissue. This test is to determine if a type of bacteria, Group B streptococcus, is present. Your caregiver will explain this further. Your caregiver may ask you:  What your birth plan is.  How you are feeling.  If you are feeling the baby move.  If you have had any abnormal symptoms, such as leaking fluid, bleeding, severe headaches, or abdominal cramping.  If you have any questions. Other tests or screenings that may be performed during your third trimester include:  Blood tests that check for low iron levels (anemia).  Fetal testing to check the health, activity level, and growth of the fetus. Testing is done if you have certain medical conditions or if there are problems during the pregnancy. FALSE LABOR You may feel small, irregular contractions that   eventually go away. These are called Braxton Hicks contractions, or false labor. Contractions may last for hours, days, or even weeks before true labor sets in. If contractions come at regular intervals, intensify, or become painful, it is best to be seen by your caregiver.  SIGNS OF LABOR   Menstrual-like cramps.  Contractions that are 5 minutes apart or less.  Contractions that start on the top of the uterus and spread down to the lower abdomen and back.  A sense of increased pelvic pressure or back  pain.  A watery or bloody mucus discharge that comes from the vagina. If you have any of these signs before the 37th week of pregnancy, call your caregiver right away. You need to go to the hospital to get checked immediately. HOME CARE INSTRUCTIONS   Avoid all smoking, herbs, alcohol, and unprescribed drugs. These chemicals affect the formation and growth of the baby.  Follow your caregiver's instructions regarding medicine use. There are medicines that are either safe or unsafe to take during pregnancy.  Exercise only as directed by your caregiver. Experiencing uterine cramps is a good sign to stop exercising.  Continue to eat regular, healthy meals.  Wear a good support bra for breast tenderness.  Do not use hot tubs, steam rooms, or saunas.  Wear your seat belt at all times when driving.  Avoid raw meat, uncooked cheese, cat litter boxes, and soil used by cats. These carry germs that can cause birth defects in the baby.  Take your prenatal vitamins.  Try taking a stool softener (if your caregiver approves) if you develop constipation. Eat more high-fiber foods, such as fresh vegetables or fruit and whole grains. Drink plenty of fluids to keep your urine clear or pale yellow.  Take warm sitz baths to soothe any pain or discomfort caused by hemorrhoids. Use hemorrhoid cream if your caregiver approves.  If you develop varicose veins, wear support hose. Elevate your feet for 15 minutes, 3-4 times a day. Limit salt in your diet.  Avoid heavy lifting, wear low heal shoes, and practice good posture.  Rest a lot with your legs elevated if you have leg cramps or low back pain.  Visit your dentist if you have not gone during your pregnancy. Use a soft toothbrush to brush your teeth and be gentle when you floss.  A sexual relationship may be continued unless your caregiver directs you otherwise.  Do not travel far distances unless it is absolutely necessary and only with the approval  of your caregiver.  Take prenatal classes to understand, practice, and ask questions about the labor and delivery.  Make a trial run to the hospital.  Pack your hospital bag.  Prepare the baby's nursery.  Continue to go to all your prenatal visits as directed by your caregiver. SEEK MEDICAL CARE IF:  You are unsure if you are in labor or if your water has broken.  You have dizziness.  You have mild pelvic cramps, pelvic pressure, or nagging pain in your abdominal area.  You have persistent nausea, vomiting, or diarrhea.  You have a bad smelling vaginal discharge.  You have pain with urination. SEEK IMMEDIATE MEDICAL CARE IF:   You have a fever.  You are leaking fluid from your vagina.  You have spotting or bleeding from your vagina.  You have severe abdominal cramping or pain.  You have rapid weight loss or gain.  You have shortness of breath with chest pain.  You notice sudden or extreme swelling   of your face, hands, ankles, feet, or legs.  You have not felt your baby move in over an hour.  You have severe headaches that do not go away with medicine.  You have vision changes. Document Released: 04/10/2001 Document Revised: 04/21/2013 Document Reviewed: 06/17/2012 [employer] Patient Information 2015 [employer]. This information is not intended to replace advice given to you by your health care provider. Make sure you discuss any questions you have with your health care provider.  

## 2014-07-26 ENCOUNTER — Encounter: Payer: Self-pay | Admitting: Family Medicine

## 2014-08-16 ENCOUNTER — Ambulatory Visit (INDEPENDENT_AMBULATORY_CARE_PROVIDER_SITE_OTHER): Payer: Medicaid Other | Admitting: Obstetrics and Gynecology

## 2014-08-16 ENCOUNTER — Other Ambulatory Visit: Payer: Self-pay | Admitting: Obstetrics and Gynecology

## 2014-08-16 VITALS — BP 121/85 | HR 97 | Temp 98.0°F | Wt 158.5 lb

## 2014-08-16 DIAGNOSIS — O0993 Supervision of high risk pregnancy, unspecified, third trimester: Secondary | ICD-10-CM

## 2014-08-16 DIAGNOSIS — Z3493 Encounter for supervision of normal pregnancy, unspecified, third trimester: Secondary | ICD-10-CM

## 2014-08-16 DIAGNOSIS — O24419 Gestational diabetes mellitus in pregnancy, unspecified control: Secondary | ICD-10-CM

## 2014-08-16 DIAGNOSIS — O09523 Supervision of elderly multigravida, third trimester: Secondary | ICD-10-CM

## 2014-08-16 LAB — POCT URINALYSIS DIP (DEVICE)
BILIRUBIN URINE: NEGATIVE
GLUCOSE, UA: NEGATIVE mg/dL
Hgb urine dipstick: NEGATIVE
LEUKOCYTES UA: NEGATIVE
NITRITE: NEGATIVE
Protein, ur: NEGATIVE mg/dL
SPECIFIC GRAVITY, URINE: 1.02 (ref 1.005–1.030)
UROBILINOGEN UA: 0.2 mg/dL (ref 0.0–1.0)
pH: 6.5 (ref 5.0–8.0)

## 2014-08-16 LAB — OB RESULTS CONSOLE GBS: STREP GROUP B AG: NEGATIVE

## 2014-08-16 LAB — OB RESULTS CONSOLE GC/CHLAMYDIA
CHLAMYDIA, DNA PROBE: NEGATIVE
Gonorrhea: NEGATIVE

## 2014-08-16 NOTE — Progress Notes (Signed)
Growth U/S 08/24/14 @ 815a with Radiology.

## 2014-08-16 NOTE — Progress Notes (Signed)
Trace ketones in urine

## 2014-08-16 NOTE — Progress Notes (Signed)
39 y.o. G3P1011 at 4780w6d here for routine OB visit.  1. A1GDM. Blood sugar log reviewed today and all at goal. Continue diet/exercise. Ordered growth scan for 38 weeks. Plan for induction by 40 weeks.  2. Routine PNC. 36 week cultures done today. FM/PTL precautions given. RTC in 1 week.

## 2014-08-16 NOTE — Progress Notes (Signed)
Cultures today 

## 2014-08-17 LAB — GC/CHLAMYDIA PROBE AMP
CT PROBE, AMP APTIMA: NEGATIVE
GC PROBE AMP APTIMA: NEGATIVE

## 2014-08-18 LAB — CULTURE, BETA STREP (GROUP B ONLY)

## 2014-08-23 ENCOUNTER — Encounter: Payer: Self-pay | Admitting: Obstetrics and Gynecology

## 2014-08-23 ENCOUNTER — Ambulatory Visit (INDEPENDENT_AMBULATORY_CARE_PROVIDER_SITE_OTHER): Payer: Medicaid Other | Admitting: Obstetrics and Gynecology

## 2014-08-23 VITALS — BP 121/77 | HR 98 | Temp 97.5°F | Wt 160.1 lb

## 2014-08-23 DIAGNOSIS — O09523 Supervision of elderly multigravida, third trimester: Secondary | ICD-10-CM

## 2014-08-23 DIAGNOSIS — O0993 Supervision of high risk pregnancy, unspecified, third trimester: Secondary | ICD-10-CM

## 2014-08-23 DIAGNOSIS — O24419 Gestational diabetes mellitus in pregnancy, unspecified control: Secondary | ICD-10-CM

## 2014-08-23 DIAGNOSIS — N90810 Female genital mutilation status, unspecified: Secondary | ICD-10-CM

## 2014-08-23 LAB — POCT URINALYSIS DIP (DEVICE)
Bilirubin Urine: NEGATIVE
Glucose, UA: NEGATIVE mg/dL
Hgb urine dipstick: NEGATIVE
Ketones, ur: NEGATIVE mg/dL
LEUKOCYTES UA: NEGATIVE
Nitrite: NEGATIVE
PROTEIN: 30 mg/dL — AB
SPECIFIC GRAVITY, URINE: 1.025 (ref 1.005–1.030)
Urobilinogen, UA: 0.2 mg/dL (ref 0.0–1.0)
pH: 5.5 (ref 5.0–8.0)

## 2014-08-23 NOTE — Progress Notes (Signed)
Patient is doing well without complaints. FM/labor precautions reviewed. CBGs reviewed and all within range. Continue diet and exercise regimen. F/U growth ultrasound 4/26

## 2014-08-24 ENCOUNTER — Ambulatory Visit (HOSPITAL_COMMUNITY)
Admission: RE | Admit: 2014-08-24 | Discharge: 2014-08-24 | Disposition: A | Discharge status: Home / Self Care | Payer: Medicaid Other | Source: Ambulatory Visit | Attending: Obstetrics and Gynecology | Admitting: Obstetrics and Gynecology

## 2014-08-24 DIAGNOSIS — O24419 Gestational diabetes mellitus in pregnancy, unspecified control: Secondary | ICD-10-CM | POA: Diagnosis not present

## 2014-08-30 ENCOUNTER — Ambulatory Visit (INDEPENDENT_AMBULATORY_CARE_PROVIDER_SITE_OTHER): Payer: Medicaid Other | Admitting: Obstetrics & Gynecology

## 2014-08-30 VITALS — BP 126/88 | HR 100 | Temp 97.6°F | Wt 162.0 lb

## 2014-08-30 DIAGNOSIS — O0993 Supervision of high risk pregnancy, unspecified, third trimester: Secondary | ICD-10-CM

## 2014-08-30 LAB — POCT URINALYSIS DIP (DEVICE)
Bilirubin Urine: NEGATIVE
Glucose, UA: 100 mg/dL — AB
Hgb urine dipstick: NEGATIVE
KETONES UR: NEGATIVE mg/dL
Leukocytes, UA: NEGATIVE
Nitrite: NEGATIVE
PH: 6 (ref 5.0–8.0)
Protein, ur: NEGATIVE mg/dL
SPECIFIC GRAVITY, URINE: 1.025 (ref 1.005–1.030)
Urobilinogen, UA: 0.2 mg/dL (ref 0.0–1.0)

## 2014-08-30 NOTE — Progress Notes (Signed)
Pain- lower abd, front of stomach  Pressure- lower abd

## 2014-08-30 NOTE — Progress Notes (Signed)
IOL 09/07/14 @ 7a.

## 2014-08-30 NOTE — Progress Notes (Signed)
7 pounds 11 ounces at 38 weeks.  CBGs under good control on diet. One BP 138/94.  No protein, no swelling, no RUQ pain, no scotomata.   BP check tomorrow.  If elevated then will induce.

## 2014-08-31 ENCOUNTER — Encounter (HOSPITAL_COMMUNITY): Payer: Self-pay

## 2014-08-31 ENCOUNTER — Inpatient Hospital Stay (HOSPITAL_COMMUNITY)
Admission: AD | Admit: 2014-08-31 | Discharge: 2014-09-03 | DRG: 782 | Discharge status: Against Medical Advice | Payer: Medicaid Other | Source: Ambulatory Visit | Attending: Family Medicine | Admitting: Family Medicine

## 2014-08-31 ENCOUNTER — Inpatient Hospital Stay (EMERGENCY_DEPARTMENT_HOSPITAL)
Admission: AD | Admit: 2014-08-31 | Discharge: 2014-08-31 | Discharge status: Against Medical Advice | Payer: Medicaid Other | Source: Ambulatory Visit | Attending: Family Medicine | Admitting: Family Medicine

## 2014-08-31 ENCOUNTER — Encounter (HOSPITAL_COMMUNITY): Payer: Self-pay | Admitting: *Deleted

## 2014-08-31 ENCOUNTER — Ambulatory Visit: Payer: Medicaid Other | Admitting: Obstetrics and Gynecology

## 2014-08-31 DIAGNOSIS — Z3A39 39 weeks gestation of pregnancy: Secondary | ICD-10-CM | POA: Diagnosis present

## 2014-08-31 DIAGNOSIS — O09529 Supervision of elderly multigravida, unspecified trimester: Secondary | ICD-10-CM

## 2014-08-31 DIAGNOSIS — O3483 Maternal care for other abnormalities of pelvic organs, third trimester: Secondary | ICD-10-CM | POA: Diagnosis present

## 2014-08-31 DIAGNOSIS — O09523 Supervision of elderly multigravida, third trimester: Secondary | ICD-10-CM

## 2014-08-31 DIAGNOSIS — Z8249 Family history of ischemic heart disease and other diseases of the circulatory system: Secondary | ICD-10-CM

## 2014-08-31 DIAGNOSIS — O133 Gestational [pregnancy-induced] hypertension without significant proteinuria, third trimester: Secondary | ICD-10-CM | POA: Diagnosis present

## 2014-08-31 DIAGNOSIS — Z833 Family history of diabetes mellitus: Secondary | ICD-10-CM | POA: Diagnosis not present

## 2014-08-31 DIAGNOSIS — N90810 Female genital mutilation status, unspecified: Secondary | ICD-10-CM

## 2014-08-31 DIAGNOSIS — O24424 Gestational diabetes mellitus in childbirth, insulin controlled: Secondary | ICD-10-CM | POA: Diagnosis not present

## 2014-08-31 DIAGNOSIS — O24419 Gestational diabetes mellitus in pregnancy, unspecified control: Secondary | ICD-10-CM | POA: Diagnosis present

## 2014-08-31 HISTORY — DX: Gestational diabetes mellitus in pregnancy, unspecified control: O24.419

## 2014-08-31 LAB — TYPE AND SCREEN
ABO/RH(D): O POS
Antibody Screen: NEGATIVE

## 2014-08-31 LAB — COMPREHENSIVE METABOLIC PANEL
ALK PHOS: 124 U/L (ref 38–126)
ALT: 12 U/L — ABNORMAL LOW (ref 14–54)
AST: 19 U/L (ref 15–41)
Albumin: 3 g/dL — ABNORMAL LOW (ref 3.5–5.0)
Anion gap: 9 (ref 5–15)
BUN: 6 mg/dL (ref 6–20)
CHLORIDE: 105 mmol/L (ref 101–111)
CO2: 23 mmol/L (ref 22–32)
Calcium: 8.9 mg/dL (ref 8.9–10.3)
Creatinine, Ser: 0.48 mg/dL (ref 0.44–1.00)
GFR calc Af Amer: >60 mL/min (ref >60)
Glucose, Bld: 93 mg/dL (ref 70–99)
POTASSIUM: 4.4 mmol/L (ref 3.5–5.1)
SODIUM: 137 mmol/L (ref 135–145)
Total Bilirubin: 0.2 mg/dL — ABNORMAL LOW (ref 0.3–1.2)
Total Protein: 6.9 g/dL (ref 6.5–8.1)

## 2014-08-31 LAB — CBC
HEMATOCRIT: 38.3 % (ref 36.0–46.0)
Hemoglobin: 12.2 g/dL (ref 12.0–15.0)
MCH: 24 pg — AB (ref 26.0–34.0)
MCHC: 31.9 g/dL (ref 30.0–36.0)
MCV: 75.4 fL — AB (ref 78.0–100.0)
PLATELETS: 216 10*3/uL (ref 150–400)
RBC: 5.08 MIL/uL (ref 3.87–5.11)
RDW: 19.7 % — AB (ref 11.5–15.5)
WBC: 6.8 10*3/uL (ref 4.0–10.5)

## 2014-08-31 LAB — PROTEIN / CREATININE RATIO, URINE
Creatinine, Urine: 125 mg/dL
Protein Creatinine Ratio: 0.22 mg/mg{Cre} — ABNORMAL HIGH (ref 0.00–0.15)
Total Protein, Urine: 27 mg/dL

## 2014-08-31 MED ORDER — LACTATED RINGERS IV SOLN
INTRAVENOUS | Status: DC
  Administered 2014-08-31 – 2014-09-01 (×2): via INTRAVENOUS
  Administered 2014-09-01: 125 mL/h via INTRAVENOUS
  Administered 2014-09-01: 03:00:00 via INTRAVENOUS

## 2014-08-31 MED ORDER — LIDOCAINE HCL (PF) 1 % IJ SOLN
30.0000 mL | INTRAMUSCULAR | Status: DC | PRN
  Filled 2014-08-31: qty 30

## 2014-08-31 MED ORDER — MISOPROSTOL 25 MCG QUARTER TABLET: 25.0000 ug | ORAL_TABLET | ORAL | Status: DC | PRN

## 2014-08-31 MED ORDER — ONDANSETRON HCL 4 MG/2ML IJ SOLN: 4.0000 mg | Freq: Four times a day (QID) | INTRAMUSCULAR | Status: DC | PRN

## 2014-08-31 MED ORDER — LACTATED RINGERS IV SOLN
500.0000 mL | INTRAVENOUS | Status: DC | PRN
  Administered 2014-09-01: 500 mL via INTRAVENOUS

## 2014-08-31 MED ORDER — OXYCODONE-ACETAMINOPHEN 5-325 MG PO TABS: 2.0000 | ORAL_TABLET | ORAL | Status: DC | PRN

## 2014-08-31 MED ORDER — OXYTOCIN BOLUS FROM INFUSION: 500.0000 mL | INTRAVENOUS | Status: DC

## 2014-08-31 MED ORDER — ACETAMINOPHEN 325 MG PO TABS
650.0000 mg | ORAL_TABLET | ORAL | Status: DC | PRN
  Administered 2014-09-01: 650 mg via ORAL
  Filled 2014-08-31: qty 2

## 2014-08-31 MED ORDER — TERBUTALINE SULFATE 1 MG/ML IJ SOLN: 0.2500 mg | Freq: Once | INTRAMUSCULAR | Status: AC | PRN

## 2014-08-31 MED ORDER — OXYTOCIN 40 UNITS IN LACTATED RINGERS INFUSION - SIMPLE MED
1.0000 m[IU]/min | INTRAVENOUS | Status: DC
Start: 2014-08-31 — End: 2014-09-01
  Administered 2014-08-31: 2 m[IU]/min via INTRAVENOUS
  Filled 2014-08-31: qty 1000

## 2014-08-31 MED ORDER — OXYTOCIN 40 UNITS IN LACTATED RINGERS INFUSION - SIMPLE MED
62.5000 mL/h | INTRAVENOUS | Status: DC
  Administered 2014-09-01: 62.5 mL/h via INTRAVENOUS

## 2014-08-31 MED ORDER — FLEET ENEMA 7-19 GM/118ML RE ENEM: 1.0000 | ENEMA | RECTAL | Status: DC | PRN

## 2014-08-31 MED ORDER — OXYCODONE-ACETAMINOPHEN 5-325 MG PO TABS: 1.0000 | ORAL_TABLET | ORAL | Status: DC | PRN

## 2014-08-31 MED ORDER — CITRIC ACID-SODIUM CITRATE 334-500 MG/5ML PO SOLN: 30.0000 mL | ORAL | Status: DC | PRN

## 2014-08-31 NOTE — MAU Provider Note (Signed)
History     CSN: [id number]  Arrival date and time: 08/31/14 1039   None     Chief Complaint  Patient presents with  . Hypertension   HPI  Patient is 39 y.o. G3P1011 [redacted]w[redacted]d referred from clinic for IOL 2/2 gestational hypertension.  Had elevated BP yesterday in clinic, returned for repeat BP today and was again elevated.  Reports she cannot stay today for IOL 2/2 no child care, reports she will go home and try to arrange child care and return for IOL.  +FM, denies LOF, VB, contractions, vaginal discharge.     Past Medical History  Diagnosis Date  . Hypertension   . Female circumcision   . Labial abscess   . AMA (advanced maternal age) multigravida 35+     Past Surgical History  Procedure Laterality Date  . No past surgeries      Family History  Problem Relation Age of Onset  . Hypertension Mother   . Diabetes Mother   . Hypertension Brother   . Hypertension Sister     History  Substance Use Topics  . Smoking status: Never Smoker   . Smokeless tobacco: Never Used  . Alcohol Use: No    Allergies: No Known Allergies  Prescriptions prior to admission  Medication Sig Dispense Refill Last Dose  . ACCU-CHEK FASTCLIX LANCETS MISC Inject 1 each into the skin 4 (four) times daily. GDM O24.419 for testing 4 times daily 102 each 12 08/31/2014 at Unknown time  . acetaminophen (TYLENOL) 500 MG tablet Take 500 mg by mouth every 6 (six) hours as needed for headache.   Past Month at Unknown time  . glucose blood (ACCU-CHEK SMARTVIEW) test strip DX Gestational Diabetes o24.419 for testing 4 times daily 100 each 12 08/31/2014 at Unknown time  . prenatal vitamin w/FE, FA (PRENATAL 1 + 1) 27-1 MG TABS tablet Take 1 tablet by mouth daily at 12 noon. 30 each 12 08/30/2014 at Unknown time  . famotidine (PEPCID) 20 MG tablet Take 1 tablet (20 mg total) by mouth 2 (two) times daily. 60 tablet 6 Taking    Review of Systems  Constitutional: Negative for chills, weight loss and  malaise/fatigue.  Eyes: Negative for blurred vision and double vision.  Neurological: Negative for dizziness and headaches.   Physical Exam   Blood pressure 130/95, pulse 99, last menstrual period 11/23/2013, currently breastfeeding.  Physical Exam  Constitutional: She is oriented to person, place, and time. She appears well-developed and well-nourished.  HENT:  Head: Normocephalic and atraumatic.  Eyes: Conjunctivae and EOM are normal.  Neck: Normal range of motion.  Cardiovascular: Normal rate.   Respiratory: Effort normal. No respiratory distress.  GI: Soft. Bowel sounds are normal. She exhibits no distension. There is no tenderness.  Musculoskeletal: Normal range of motion. She exhibits no edema.  Neurological: She is alert and oriented to person, place, and time. She has normal reflexes. No cranial nerve deficit.  Skin: Skin is warm and dry. No erythema.    MAU Course  Procedures  MDM NST reactive  Assessment and Plan  Patient is 39 y.o. G3P1011 [redacted]w[redacted]d referred from clinic for IOL 2/2 gestational HTN - fetal kick counts reinforced - patient declines IOL at this time, reports she will do her best to return tonight but likely return tomorrow - advised at length of risks of leaving against medical advise, preeclampsia precautions discussed.  Pt understands risks and refuses to stay for IOL. - serial BPs while in MAU elevated but no  severe range, labs normal, no signs of preeclampsia at this time   Debra Delgado ROCIO 08/31/2014, 12:23 PM

## 2014-08-31 NOTE — Progress Notes (Signed)
Baby active 

## 2014-08-31 NOTE — H&P (Signed)
LABOR ADMISSION HISTORY AND PHYSICAL  Debra Delgado is a 39 y.o. female G3P1011 with IUP at [redacted]w[redacted]d by LMP presenting for IOL 2/2 gHTN. She reports +FMs.  Denies LOF, VB, blurry vision, headaches, peripheral edema, RUQ pain.  She plans on Breast feeding. She request Nexplanon vs OCPs for birth control.  Plans on bringing child to [facility] for pediatric care after discharge.  Dating: By LMP --->  Estimated Date of Delivery: 09/07/14  Sono:   , CWD, normal anatomy, cephalic presentation, 3481g, 16% EFW  Per Dr Penne Lash: 7 pounds 11 ounces at 38 weeks. CBGs under good control on diet. One BP 138/94. No protein, no swelling, no RUQ pain, no scotomata.  BP check tomorrow. If elevated then will induce.           Prenatal History/Complications:  Clinic   [facility] Prenatal Labs  Dating LMP Blood type: O/POS/-- (12/16 1623)  Genetic Screen 1 Screen:                 AFP:                    Quad: Declined    NIPS: Antibody:NEG (12/16 1623)  Anatomic [place] normal female, but uncomplete anatomy. F/U 4-6 weeks--?change EDD.  Rubella: 29.10 (12/16 1623)  GTT Early: 128       Third trimester: 153, abnormal 3 hr GTT RPR: NON REAC (12/16 1623)   TDaP vaccine 06/09/14 HBsAg: NEGATIVE (12/16 1623)   Flu vaccine 04/14/14 HIV: NONREACTIVE (12/16 1623)   GBS  negative GBS: negative  Contraception Nexplanon or pills Pap: Normal at [facility] 2015  Baby Food Breast   Circumcision No   Pediatrician [employer] [facility]   Support Person Husband    Past Medical History: Past Medical History  Diagnosis Date  . Hypertension   . Female circumcision   . Labial abscess   . AMA (advanced maternal age) multigravida 35+     Past Surgical History: Past Surgical History  Procedure Laterality Date  . No past surgeries      Obstetrical History: OB History    Gravida Para Term Preterm AB TAB SAB Ectopic Multiple Living   Social History: History   Social History  . Marital Status:  Married    Spouse Name: N/A  . Number of Children: N/A  . Years of Education: N/A   Social History Main Topics  . Smoking status: Never Smoker   . Smokeless tobacco: Never Used  . Alcohol Use: No  . Drug Use: No  . Sexual Activity: Yes    Birth Control/ Protection: None   Other Topics Concern  . Not on file   Social History Narrative    Family History: Family History  Problem Relation Age of Onset  . Hypertension Mother   . Diabetes Mother   . Hypertension Brother   . Hypertension Sister     Allergies: No Known Allergies  Prescriptions prior to admission  Medication Sig Dispense Refill Last Dose  . ACCU-CHEK FASTCLIX LANCETS MISC Inject 1 each into the skin 4 (four) times daily. GDM O24.419 for testing 4 times daily 102 each 12 08/31/2014 at Unknown time  . acetaminophen (TYLENOL) 500 MG tablet Take 500 mg by mouth every 6 (six) hours as needed for headache.   Past Month at Unknown time  . famotidine (PEPCID) 20 MG tablet Take 1 tablet (20 mg total) by  mouth 2 (two) times daily. 60 tablet 6 Taking  . glucose blood (ACCU-CHEK SMARTVIEW) test strip DX Gestational Diabetes o24.419 for testing 4 times daily 100 each 12 08/31/2014 at Unknown time  . prenatal vitamin w/FE, FA (PRENATAL 1 + 1) 27-1 MG TABS tablet Take 1 tablet by mouth daily at 12 noon. 30 each 12 08/30/2014 at Unknown time   Medical, surgical, family and social history reviewed.   Review of Systems   All systems reviewed and negative except as stated in HPI Review of Systems  Constitutional: Negative for fever, chills and malaise/fatigue.  Eyes: Negative for blurred vision and double vision.  Cardiovascular: Negative for chest pain.  Gastrointestinal: Positive for abdominal pain (with some contractions). Negative for nausea, vomiting, diarrhea and constipation.  Genitourinary: Negative for dysuria.  Neurological: Negative for dizziness, focal weakness, weakness and headaches.    Last menstrual period  11/23/2013, currently breastfeeding. General appearance: alert, cooperative and no distress Lungs: clear to auscultation bilaterally, no increased WOB Heart: regular rate and rhythm, no m/r/g Abdomen: soft, non-tender; bowel sounds normal  Pelvic:  Cervix Dilation: 2 Effacement (%): 70 Cervical Position: Posterior Station: -3 Presentation: Vertex Exam by:: Hilda LiasMarie, CNM                 Gynecoid pelvis                 EFW 7.5 lbs Fetal heart rate reactive UCs irregular, initially every 8-9  Min now every minute Extremities: WWP, Homans sign is negative, no sign of DVT, +2 DP Neuro: patellar DTRs 2+, no clonus, no edema Presentation: cephalic  Prenatal labs: ABO, Rh: O/POS/-- (12/16 1623) Antibody: NEG (12/16 1623) Rubella:  IMMUNE RPR: NON REAC (02/10 1612)  HBsAg: NEGATIVE (12/16 1623)  HIV: NONREACTIVE (02/10 1612)  GBS: Negative (04/18 0000)  1 hr Glucola 128, 3rd trimester: 3h GTT abnormal Genetic screening  declined Anatomy US normal  Prenatal Transfer Tool  Maternal Diabetes: Yes:  Diabetes Type:  Diet controlled Genetic Screening: Declined Maternal Ultrasounds/Referrals: Normal Fetal Ultrasounds or other Referrals:  None Maternal Substance Abuse:  No Significant Maternal Medications:  None Significant Maternal Lab Results: Lab values include: Group B Strep negative  Results for orders placed or performed during the hospital encounter of 08/31/14 (from the past 24 hour(s))  CBC   Collection Time: 08/31/14 11:00 AM  Result Value Ref Range   WBC 6.8 4.0 - 10.5 K/uL   RBC 5.08 3.87 - 5.11 MIL/uL   Hemoglobin 12.2 12.0 - 15.0 g/dL   HCT 04.538.3 40.936.0 - 81.146.0 %   MCV 75.4 (L) 78.0 - 100.0 fL   MCH 24.0 (L) 26.0 - 34.0 pg   MCHC 31.9 30.0 - 36.0 g/dL   RDW 91.419.7 (H) 78.211.5 - 95.615.5 %   Platelets 216 150 - 400 K/uL  Comprehensive metabolic panel   Collection Time: 08/31/14 11:00 AM  Result Value Ref Range   Sodium 137 135 - 145 mmol/L   Potassium 4.4 3.5 - 5.1 mmol/L    Chloride 105 101 - 111 mmol/L   CO2 23 22 - 32 mmol/L   Glucose, Bld 93 70 - 99 mg/dL   BUN 6 6 - 20 mg/dL   Creatinine, Ser 2.130.48 0.44 - 1.00 mg/dL   Calcium 8.9 8.9 - 08.610.3 mg/dL   Total Protein 6.9 6.5 - 8.1 g/dL   Albumin 3.0 (L) 3.5 - 5.0 g/dL   AST 19 15 - 41 U/L   ALT 12 (L) 14 -  54 U/L   Alkaline Phosphatase 124 38 - 126 U/L   Total Bilirubin 0.2 (L) 0.3 - 1.2 mg/dL   GFR calc non Af Amer >60 >60 mL/min   GFR calc Af Amer >60 >60 mL/min   Anion gap 9 5 - 15  Protein / creatinine ratio, urine   Collection Time: 08/31/14 11:20 AM  Result Value Ref Range   Creatinine, Urine 125.00 mg/dL   Total Protein, Urine 27 mg/dL   Protein Creatinine Ratio 0.22 (H) 0.00 - 0.15 mg/mg[Cre]    Patient Active Problem List   Diagnosis Date Noted  . Gestational hypertension w/o significant proteinuria in 3rd trimester 08/31/2014  . Preeclampsia 08/31/2014  . Gestational diabetes mellitus, antepartum 06/21/2014  . Supervision of high risk pregnancy, antepartum 04/14/2014  . AMA (advanced maternal age) multigravida 35+ 03/06/2011  . 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 Maternal varicella, non-immune 03/06/2011    Assessment: Debra Delgado is a 39 y.o. G3P1011 at [redacted]w[redacted]d here for IOL 2/2 gHTN, A1GDM  #Labor: Already 2cm. Having irregular contractions #Pain: Plans epidural #ID:  GBS neg #MOF: Breast #MOC:Nexplanon vs OCPs #Circ:  declines  Raliegh Ip, DO 08/31/2014, 7:18 PM  I examined this patient for H&P.  I amended the above note and added things as appropriate Discussed plan of care Since her cervix is partially favorable, will start Pitocin  She is contracting too often for Cytotec

## 2014-08-31 NOTE — Progress Notes (Addendum)
Dr Loreta AveAcosta in and explaining preeclampsia to pt. Dr Loreta AveAcosta explained in detail about preeclampsia and if b/ps are high and labs abnormal she cannot let pt leave. If her labs are ok but bp high pt will have have to sign AMA form if she leaves. Pt voices understanding

## 2014-08-31 NOTE — Progress Notes (Signed)
Dr Loreta AveAcosta notified pt does want to leave now and will sign AMA form

## 2014-08-31 NOTE — MAU Note (Signed)
Dr Loreta AveAcosta called and ok for pt to leave if pt insists. Labs are ok but b/p remains elevated. If pt leaves she must sign AMA form per Dr Loreta AveAcosta

## 2014-08-31 NOTE — Progress Notes (Signed)
Pt up to [facility]

## 2014-08-31 NOTE — Progress Notes (Signed)
Dr Loreta AveAcosta on unit and in to see pt

## 2014-08-31 NOTE — MAU Note (Signed)
Pt signed AMA form. PT given 'Preeclampsia' info sheet. Pt states she plans to return later today after she has things in order at home.

## 2014-08-31 NOTE — MAU Note (Signed)
Pt sent up from clinic. For induction, BP elevation. No room in L&D at this time.

## 2014-08-31 NOTE — MAU Note (Signed)
Was sent up from clinic. Clinic wanted to induce her due to HTN but pt wants to wait until tomorrow to get things ready at home.

## 2014-09-01 ENCOUNTER — Inpatient Hospital Stay (HOSPITAL_COMMUNITY): Payer: Medicaid Other | Admitting: Anesthesiology

## 2014-09-01 ENCOUNTER — Encounter (HOSPITAL_COMMUNITY): Payer: Self-pay | Admitting: *Deleted

## 2014-09-01 DIAGNOSIS — O24424 Gestational diabetes mellitus in childbirth, insulin controlled: Secondary | ICD-10-CM

## 2014-09-01 DIAGNOSIS — Z3A39 39 weeks gestation of pregnancy: Secondary | ICD-10-CM

## 2014-09-01 DIAGNOSIS — O133 Gestational [pregnancy-induced] hypertension without significant proteinuria, third trimester: Secondary | ICD-10-CM

## 2014-09-01 LAB — CBC
HCT: 35.4 % — ABNORMAL LOW (ref 36.0–46.0)
HEMATOCRIT: 33.5 % — AB (ref 36.0–46.0)
Hemoglobin: 10.7 g/dL — ABNORMAL LOW (ref 12.0–15.0)
Hemoglobin: 11.6 g/dL — ABNORMAL LOW (ref 12.0–15.0)
MCH: 23.9 pg — AB (ref 26.0–34.0)
MCH: 24.5 pg — ABNORMAL LOW (ref 26.0–34.0)
MCHC: 31.9 g/dL (ref 30.0–36.0)
MCHC: 32.8 g/dL (ref 30.0–36.0)
MCV: 74.8 fL — ABNORMAL LOW (ref 78.0–100.0)
MCV: 74.8 fL — ABNORMAL LOW (ref 78.0–100.0)
PLATELETS: 196 10*3/uL (ref 150–400)
PLATELETS: 190 10*3/uL (ref 150–400)
RBC: 4.48 MIL/uL (ref 3.87–5.11)
RBC: 4.73 MIL/uL (ref 3.87–5.11)
RDW: 19.8 % — ABNORMAL HIGH (ref 11.5–15.5)
RDW: 20.7 % — ABNORMAL HIGH (ref 11.5–15.5)
WBC: 7.8 10*3/uL (ref 4.0–10.5)
WBC: 8.2 10*3/uL (ref 4.0–10.5)

## 2014-09-01 LAB — GLUCOSE, CAPILLARY: GLUCOSE-CAPILLARY: 82 mg/dL (ref 70–99)

## 2014-09-01 LAB — RPR: RPR: NONREACTIVE

## 2014-09-01 MED ORDER — LANOLIN HYDROUS EX OINT: TOPICAL_OINTMENT | CUTANEOUS | Status: DC | PRN

## 2014-09-01 MED ORDER — SENNOSIDES-DOCUSATE SODIUM 8.6-50 MG PO TABS
2.0000 | ORAL_TABLET | ORAL | Status: DC
  Administered 2014-09-02: 2 via ORAL
  Filled 2014-09-01: qty 2

## 2014-09-01 MED ORDER — SIMETHICONE 80 MG PO CHEW
80.0000 mg | CHEWABLE_TABLET | ORAL | Status: DC | PRN
Start: 2014-09-01 — End: 2014-09-03

## 2014-09-01 MED ORDER — LIDOCAINE HCL (PF) 1 % IJ SOLN
INTRAMUSCULAR | Status: DC | PRN
  Administered 2014-09-01 (×2): 8 mL

## 2014-09-01 MED ORDER — ONDANSETRON HCL 4 MG/2ML IJ SOLN: 4.0000 mg | INTRAMUSCULAR | Status: DC | PRN

## 2014-09-01 MED ORDER — BENZOCAINE-MENTHOL 20-0.5 % EX AERO
1.0000 "application " | INHALATION_SPRAY | CUTANEOUS | Status: DC | PRN
  Filled 2014-09-01: qty 56

## 2014-09-01 MED ORDER — WITCH HAZEL-GLYCERIN EX PADS: 1.0000 "application " | MEDICATED_PAD | CUTANEOUS | Status: DC | PRN

## 2014-09-01 MED ORDER — ONDANSETRON HCL 4 MG PO TABS: 4.0000 mg | ORAL_TABLET | ORAL | Status: DC | PRN

## 2014-09-01 MED ORDER — PRENATAL MULTIVITAMIN CH
1.0000 | ORAL_TABLET | Freq: Every day | ORAL | Status: DC
  Administered 2014-09-02 – 2014-09-03 (×2): 1 via ORAL
  Filled 2014-09-01 (×2): qty 1

## 2014-09-01 MED ORDER — ACETAMINOPHEN 325 MG PO TABS: 650.0000 mg | ORAL_TABLET | ORAL | Status: DC | PRN

## 2014-09-01 MED ORDER — DIPHENHYDRAMINE HCL 25 MG PO CAPS
25.0000 mg | ORAL_CAPSULE | Freq: Four times a day (QID) | ORAL | Status: DC | PRN
Start: 2014-09-01 — End: 2014-09-03

## 2014-09-01 MED ORDER — FENTANYL 2.5 MCG/ML BUPIVACAINE 1/10 % EPIDURAL INFUSION (WH - ANES)
14.0000 mL/h | INTRAMUSCULAR | Status: DC | PRN
  Administered 2014-09-01 (×2): 14 mL/h via EPIDURAL
  Filled 2014-09-01: qty 125

## 2014-09-01 MED ORDER — ZOLPIDEM TARTRATE 5 MG PO TABS: 5.0000 mg | ORAL_TABLET | Freq: Every evening | ORAL | Status: DC | PRN

## 2014-09-01 MED ORDER — IBUPROFEN 600 MG PO TABS
600.0000 mg | ORAL_TABLET | Freq: Four times a day (QID) | ORAL | Status: DC
  Administered 2014-09-01 – 2014-09-03 (×7): 600 mg via ORAL
  Filled 2014-09-01 (×7): qty 1

## 2014-09-01 MED ORDER — OXYCODONE-ACETAMINOPHEN 5-325 MG PO TABS
1.0000 | ORAL_TABLET | ORAL | Status: DC | PRN
  Administered 2014-09-02 (×2): 1 via ORAL
  Filled 2014-09-01 (×2): qty 1

## 2014-09-01 MED ORDER — OXYCODONE-ACETAMINOPHEN 5-325 MG PO TABS: 2.0000 | ORAL_TABLET | ORAL | Status: DC | PRN

## 2014-09-01 MED ORDER — DIBUCAINE 1 % RE OINT: 1.0000 "application " | TOPICAL_OINTMENT | RECTAL | Status: DC | PRN

## 2014-09-01 MED ORDER — EPHEDRINE 5 MG/ML INJ
10.0000 mg | INTRAVENOUS | Status: DC | PRN
  Filled 2014-09-01: qty 2

## 2014-09-01 MED ORDER — DIPHENHYDRAMINE HCL 50 MG/ML IJ SOLN: 12.5000 mg | INTRAMUSCULAR | Status: DC | PRN

## 2014-09-01 MED ORDER — TETANUS-DIPHTH-ACELL PERTUSSIS 5-2.5-18.5 LF-MCG/0.5 IM SUSP
0.5000 mL | Freq: Once | INTRAMUSCULAR | Status: AC
  Administered 2014-09-02: 0.5 mL via INTRAMUSCULAR

## 2014-09-01 MED ORDER — PHENYLEPHRINE 40 MCG/ML (10ML) SYRINGE FOR IV PUSH (FOR BLOOD PRESSURE SUPPORT)
80.0000 ug | PREFILLED_SYRINGE | INTRAVENOUS | Status: DC | PRN
  Filled 2014-09-01: qty 20
  Filled 2014-09-01: qty 2

## 2014-09-01 NOTE — Anesthesia Procedure Notes (Signed)
Epidural Patient location during procedure: OB Start time: 09/01/2014 4:46 PM End time: 09/01/2014 4:50 PM  Staffing Anesthesiologist: Leilani AbleHATCHETT, Kaimana Neuzil  Preanesthetic Checklist Completed: patient identified, surgical consent, pre-op evaluation, timeout performed, IV checked, risks and benefits discussed and monitors and equipment checked  Epidural Patient position: sitting Prep: site prepped and draped and DuraPrep Patient monitoring: continuous pulse ox and blood pressure Approach: midline Location: L3-L4 Injection technique: LOR air  Needle:  Needle type: Tuohy  Needle gauge: 17 G Needle length: 9 cm and 9 Needle insertion depth: 5 cm cm Catheter type: closed end flexible Catheter size: 19 Gauge Catheter at skin depth: 10 cm Test dose: negative and Other  Assessment Sensory level: T9 Events: blood not aspirated, injection not painful, no injection resistance, negative IV test and paresthesia  Additional Notes L leg X 1Reason for block:procedure for pain

## 2014-09-01 NOTE — Progress Notes (Signed)
Lab in room to draw cbc

## 2014-09-01 NOTE — Progress Notes (Signed)
Patient ID: Debra Delgado, female   DOB: 02/11/1976, 39 y.o.   MRN: [id number] Filed Vitals:   08/31/14 2302 09/01/14 0001 09/01/14 0032 09/01/14 0102  BP: 125/84 122/79 109/63 123/78  Pulse: 98 98 100 107  Temp:  97.4 F (36.3 C)    TempSrc:      Resp:  16 18 16   Height:      Weight:       Results for orders placed or performed during the hospital encounter of 08/31/14 (from the past 72 hour(s))  Type and screen     Status: None   Collection Time: 08/31/14  8:00 PM  Result Value Ref Range   ABO/RH(D) O POS    Antibody Screen NEG    Sample Expiration 09/03/2014    Results for [name], [name] as of 09/01/2014 01:37  Ref. Range 08/31/2014 11:20  Protein Creatinine Ratio Latest Ref Range: 0.00-0.15 mg/mgCre 0.22 (H)   FHR reactive UCs every 4 min  Continue to observe

## 2014-09-01 NOTE — Progress Notes (Signed)
Patient ID: Melina SchoolsEsmhan Dominey, female   DOB: 01/19/1976, 39 y.o.   MRN: [id number] Filed Vitals:   09/01/14 0102 09/01/14 0132 09/01/14 0202 09/01/14 0232  BP: 123/78 103/64 121/79 113/75  Pulse: 107 101 97 99  Temp:      TempSrc:      Resp: 16     Height:      Weight:       FHR reactive UCs not tracing  Will continue Cytotec

## 2014-09-01 NOTE — Progress Notes (Signed)
Debra Delgado is a 39 y.o. G3P1011 at 7980w1d by LMP admitted for induction of labor due to Hypertension.  Subjective: Patient reports that she is doing well.  She states that she can feel contractions but pain is tolerable at this time.  Objective: BP 114/58 mmHg  Pulse 95  Temp(Src) 97.4 F (36.3 C) (Oral)  Resp 18  Ht 5\' 6"  (1.676 m)  Wt 162 lb (73.483 kg)  BMI 26.16 kg/m2  LMP 11/23/2013 (Approximate) I/O last 3 completed shifts: In: 502.1 [I.V.:502.1] Out: -    FHT:  FHR: 135 bpm, variability: moderate,  accelerations:  Present,  decelerations:  Absent UC:   irregular, every 1-5 minutes SVE:   Dilation: 2 Effacement (%): 70 Station: -1 Exam by:: dr Walker Shadowgoschalk  Labs: Lab Results  Component Value Date   WBC 6.8 08/31/2014   HGB 12.2 08/31/2014   HCT 38.3 08/31/2014   MCV 75.4* 08/31/2014   PLT 216 08/31/2014    Assessment / Plan: Induction of labor due to gestational hypertension,  Prolonged latent phase.  FB placed.  Labor: FB placed.  Pit reduced to 6.   Fetal Wellbeing:  Category I Pain Control:  Labor support without medications I/D:  n/a Anticipated MOD:  NSVD  Raliegh IpGottschalk, Pamella Samons M, DO 09/01/2014, 10:59 AM

## 2014-09-01 NOTE — Anesthesia Preprocedure Evaluation (Signed)
Anesthesia Evaluation  Patient identified by MRN, date of birth, ID band Patient awake    Reviewed: Allergy & Precautions, H&P , NPO status , Patient's Chart, lab work & pertinent test results  Airway Mallampati: I  TM Distance: >3 FB Neck ROM: full    Dental no notable dental hx.    Pulmonary neg pulmonary ROS,    Pulmonary exam normal       Cardiovascular hypertension, Normal cardiovascular exam    Neuro/Psych negative neurological ROS  negative psych ROS   GI/Hepatic negative GI ROS, Neg liver ROS,   Endo/Other  diabetes  Renal/GU negative Renal ROS     Musculoskeletal   Abdominal Normal abdominal exam  (+)   Peds  Hematology negative hematology ROS (+)   Anesthesia Other Findings   Reproductive/Obstetrics (+) Pregnancy                             Anesthesia Physical Anesthesia Plan  ASA: II  Anesthesia Plan: Epidural   Post-op Pain Management:    Induction:   Airway Management Planned:   Additional Equipment:   Intra-op Plan:   Post-operative Plan:   Informed Consent: I have reviewed the patients History and Physical, chart, labs and discussed the procedure including the risks, benefits and alternatives for the proposed anesthesia with the patient or authorized representative who has indicated his/her understanding and acceptance.     Plan Discussed with:   Anesthesia Plan Comments:         Anesthesia Quick Evaluation

## 2014-09-01 NOTE — Progress Notes (Signed)
Pt c/o pressure below breast breast bilaterally at what would be the bra line.  Pt not in any resp distress, pulse ox 100%.

## 2014-09-01 NOTE — Progress Notes (Signed)
   Subjective: Pt reports continued comfort.  Desires epidural if AROM.    Objective: BP 117/65 mmHg  Pulse 85  Temp(Src) 97.8 F (36.6 C) (Oral)  Resp 18  Ht 5\' 6"  (1.676 m)  Wt 73.483 kg (162 lb)  BMI 26.16 kg/m2  LMP 11/23/2013 (Approximate) I/O last 3 completed shifts: In: 502.1 [I.V.:502.1] Out: -     FHT:  FHR: 120's bpm, variability: moderate,  accelerations:  Present,  decelerations:  Absent UC:   2-3 SVE:   Dilation: 5 Effacement (%): 70 Station: -1 Exam by:: karim AROM small amount of clear fluid Labs: Lab Results  Component Value Date   WBC 6.8 08/31/2014   HGB 12.2 08/31/2014   HCT 38.3 08/31/2014   MCV 75.4* 08/31/2014   PLT 216 08/31/2014    Assessment / Plan: Induction of labor due to gestational hypertension and gestational diabetes,  progressing well on pitocin  Labor: Progressing normally; continue increasing pitocin.  Preeclampsia:  labs stable Fetal Wellbeing:  Category I Pain Control:  Epidural I/D:  GBS neg Anticipated MOD:  NSVD  KARIM, Nithya Meriweather N 09/01/2014, 4:04 PM

## 2014-09-02 NOTE — Progress Notes (Signed)
Post Partum Day 1 Subjective:  Debra Delgado Perkin is a 39 y.o. [id number] 792w1d s/p SVD.  No acute events overnight.  Pt denies problems with ambulating, voiding or po intake.  She denies nausea or vomiting.  Pain is well controlled.  She has had flatus. She has not had bowel movement.  Lochia Moderate.  Plan for birth control is Nexplanon.  Method of Feeding: Breast  Objective: Blood pressure 136/83, pulse 81, temperature 97.7 F (36.5 C), temperature source Oral, resp. rate 16, height 5\' 6"  (1.676 m), weight 162 lb (73.483 kg), last menstrual period 11/23/2013, SpO2 100 %, unknown if currently breastfeeding.  Physical Exam:  General: alert, cooperative and no distress Lochia:normal flow Chest: CTAB, no increased WOB Heart: RRR no m/r/g Abdomen: +BS, soft, nontender,  Uterine Fundus: firm DVT Evaluation: No evidence of DVT seen on physical exam. Extremities: WWP, no edema   Recent Labs  09/01/14 1614 09/01/14 2254  HGB 10.7* 11.6*  HCT 33.5* 35.4*    Assessment/Plan:  ASSESSMENT: Debra Delgado Debra Delgado is a 39 y.o. A5W0981G3P2012 [redacted]w[redacted]d s/p SVD  Plan for discharge tomorrow and Breastfeeding   LOS: 2 days   Delynn FlavinGottschalk, Ashly M, DO 09/02/2014, 7:48 AM   Attestation of Attending Supervision of Resident: Evaluation and management procedures were performed by the [facility] under my supervision.  I have seen and examined the patient, reviewed the resident's note and chart, and I agree with the management and plan.  Anibal Hendersonarolyn L Harraway-Smith, M.D. 09/02/2014 11:44 AM

## 2014-09-02 NOTE — Lactation Note (Signed)
This note was copied from the chart of Debra Satonya Keithly. Lactation Consultation Note  Patient Name: Debra Delgado BJYNW'GToday's Date: 09/02/2014 Reason for consult: Follow-up assessment   Follow-up at 24 hours old.  Mom is a P2 with 1.5 yrs BF experience with previous child.   Infant has breastfed x4 (10-40 min) + attempts x3 (0-5 min); voids-3; stools-3. Infant asleep in crib not showing feeding cues.   Reviewed continue feeding with cues, and educated on cluster feeding.  Encouraged continuing exclusive breastfeeding.   Mom reports infant is feeding well.  Reports hearing swallows with feeding.  No c/o.  Denies any pain with BF.   >24 hrs since last LS.  Encouraged mom to call for latch check during the night.     Maternal Data    Feeding Feeding Type: Breast Fed Length of feed: 20 min  LATCH Score/Interventions                      Lactation Tools Discussed/Used     Consult Status Consult Status: Follow-up Date: 09/03/14 Follow-up type: In-patient    Debra Delgado, Debra Delgado 09/02/2014, 10:11 PM

## 2014-09-02 NOTE — Lactation Note (Signed)
This note was copied from the chart of Debra Delgado. Lactation Consultation Note Experienced BF mom states she BF her daughter for 1 1/2 yr. States this baby has only BF one time. Doing OK. Mom encouraged to feed baby 8-12 times/24 hours and with feeding cues. Mom encouraged to do skin-to-skin. Mom encouraged to waken baby for feeds. WH/LC brochure given w/resources, support groups and LC services.  Educated about newborn behavior. Reviewed Baby & Me book's Breastfeeding Basics. Encouraged to call for assistance if needed and to verify proper latch. Mom denies painful latches. Tired states going well so far to early to tell. Patient Name: Debra Melina Schoolssmhan Mcdonagh WUJWJ'XToday's Date: 09/02/2014 Reason for consult: Initial assessment   Maternal Data Has patient been taught Hand Expression?: Yes Does the patient have breastfeeding experience prior to this delivery?: Yes  Feeding Feeding Type: Breast Fed Length of feed: 40 min  LATCH Score/Interventions Latch: Grasps breast easily, tongue down, lips flanged, rhythmical sucking.  Audible Swallowing: None Intervention(s): Skin to skin  Type of Nipple: Everted at rest and after stimulation  Comfort (Breast/Nipple): Soft / non-tender     Hold (Positioning): No assistance needed to correctly position infant at breast. Intervention(s): Breastfeeding basics reviewed;Support Pillows;Position options;Skin to skin  LATCH Score: 8  Lactation Tools Discussed/Used     Consult Status Consult Status: Follow-up Date: 09/03/14 Follow-up type: In-patient    Elvenia Godden, Diamond NickelLAURA G 09/02/2014, 2:09 AM

## 2014-09-02 NOTE — Progress Notes (Signed)
UR chart review completed.  

## 2014-09-02 NOTE — Anesthesia Postprocedure Evaluation (Signed)
Anesthesia Post Note  Patient: Debra Delgado  Procedure(s) Performed: * No procedures listed *  Anesthesia type: Epidural  Patient location: Mother/Baby  Post pain: Pain level controlled  Post assessment: Post-op Vital signs reviewed  Last Vitals:  Filed Vitals:   09/02/14 0515  BP: 136/83  Pulse: 81  Temp: 36.5 C  Resp: 16    Post vital signs: Reviewed  Level of consciousness:alert  Complications: No apparent anesthesia complications

## 2014-09-03 MED ORDER — IBUPROFEN 600 MG PO TABS: 600.0000 mg | ORAL_TABLET | Freq: Four times a day (QID) | ORAL | Status: DC

## 2014-09-03 NOTE — Lactation Note (Signed)
This note was copied from the chart of Debra Delgado. Lactation Consultation Note: Baby in car seat ready for DC. Experienced BF mom. Reports a little pain with latch but then eases off.  Reports breasts are feeling fuller this morning. No questions at present. To call prn  Patient Name: Debra Melina Schoolssmhan Dust ZOXWR'UToday's Date: 09/03/2014 Reason for consult: Follow-up assessment   Maternal Data Does the patient have breastfeeding experience prior to this delivery?: Yes  Feeding   LATCH Score/Interventions                      Lactation Tools Discussed/Used     Consult Status Consult Status: Complete    Pamelia HoitWeeks, Amandy Chubbuck D 09/03/2014, 12:21 PM

## 2014-09-03 NOTE — Discharge Summary (Signed)
  Obstetric Discharge Summary Reason for Admission: onset of labor and augmentation d/t GHTN at term Prenatal Procedures: ultrasound Intrapartum Procedures: spontaneous vaginal delivery Postpartum Procedures: none Complications-Operative and Postpartum: none  Hospital Course: Labor was augmented and she had an uncomplicated SVD. BP are normal.  Delivery Note At 9:43 PM a viable female was delivered via Vaginal, Spontaneous Delivery (Presentation: Right Occiput Anterior).  APGAR: 9, 9; weight 8 lb 1.1 oz (3660 g).   Placenta status: Intact, Spontaneous.  Cord: 3 vessels with the following complications: None.     H/H: Lab Results  Component Value Date/Time   HGB 11.6* 09/01/2014 10:54 PM   HCT 35.4* 09/01/2014 10:54 PM      Discharge Diagnoses: Term Pregnancy-delivered GHTN, improved A1DM Discharge Information: Date: 11/09/2010 Activity: pelvic rest Diet: routine  Medications: Ibuprofen Breast feeding:  Yes Condition: stable Instructions: refer to handout Discharge to: home      Medication List    STOP taking these medications        ACCU-CHEK FASTCLIX LANCETS Misc     acetaminophen 500 MG tablet  Commonly known as:  TYLENOL     famotidine 20 MG tablet  Commonly known as:  PEPCID     glucose blood test strip  Commonly known as:  ACCU-CHEK SMARTVIEW     hydrocortisone cream 1 %     prenatal vitamin w/FE, FA 27-1 MG Tabs tablet      TAKE these medications        ibuprofen 600 MG tablet  Commonly known as:  ADVIL,MOTRIN  Take 1 tablet (600 mg total) by mouth every 6 (six) hours.           Follow-up Information    [facility] Schedule an appointment as soon as possible for a visit in 4 weeks.   Specialty:  Obstetrics and Gynecology   Why:  postpartum check up   Contact information:   [address] [place] [id number] [phone]      CRESENZO-DISHMAN,Jamori Biggar 09/03/2014,7:29 AM

## 2014-09-03 NOTE — Discharge Instructions (Signed)

## 2014-09-06 ENCOUNTER — Encounter: Payer: Self-pay | Admitting: Obstetrics and Gynecology

## 2014-09-07 ENCOUNTER — Inpatient Hospital Stay (HOSPITAL_COMMUNITY): Admission: RE | Admit: 2014-09-07 | Payer: Medicaid Other | Source: Ambulatory Visit

## 2014-10-14 ENCOUNTER — Encounter: Payer: Self-pay | Admitting: Family

## 2014-10-14 ENCOUNTER — Ambulatory Visit (INDEPENDENT_AMBULATORY_CARE_PROVIDER_SITE_OTHER): Payer: Medicaid Other | Admitting: Family

## 2014-10-14 VITALS — BP 127/90 | HR 91 | Temp 98.4°F | Wt 140.4 lb

## 2014-10-14 DIAGNOSIS — Z30013 Encounter for initial prescription of injectable contraceptive: Secondary | ICD-10-CM

## 2014-10-14 DIAGNOSIS — Z01812 Encounter for preprocedural laboratory examination: Secondary | ICD-10-CM

## 2014-10-14 LAB — POCT PREGNANCY, URINE: Preg Test, Ur: NEGATIVE

## 2014-10-14 MED ORDER — ETONOGESTREL 68 MG ~~LOC~~ IMPL
68.0000 mg | DRUG_IMPLANT | Freq: Once | SUBCUTANEOUS | Status: AC
  Administered 2014-10-14: 68 mg via SUBCUTANEOUS

## 2014-10-14 NOTE — Progress Notes (Signed)
Patient ID: Debra Delgado, female   DOB: 1976/02/26, 39 y.o.   MRN: [id number] Subjective:     Debra Delgado is a 39 y.o. female who presents for a postpartum visit. She is 6 weeks postpartum following a spontaneous vaginal delivery. I have fully reviewed the prenatal and intrapartum course. The delivery was at 39 gestational weeks. Outcome: spontaneous vaginal delivery. Anesthesia: epidural. Postpartum course has been unremarkable. Baby's course has been unremarkable. Baby is feeding by breast. Bleeding no bleeding. Last bleeding one week ago.  + vaginal itching that started one week after delivery.  Bowel function is normal. Bladder function is normal. Patient is not sexually active. Contraception method is none. Desires Nexplanon.  Used in the past with no problems.  Postpartum depression screening: negative.  The following portions of the patient's history were reviewed and updated as appropriate: allergies, current medications, past family history, past medical history, past social history, past surgical history and problem list.  Review of Systems Pertinent items are noted in HPI.   Objective:   Filed Vitals:   10/14/14 1400  BP: 127/90  Pulse:   Temp:     General:  alert, cooperative and appears stated age   Breasts:  inspection negative, no nipple discharge or bleeding, no masses or nodularity palpable  Lungs: clear to auscultation bilaterally  Heart:  regular rate and rhythm, S1, S2 normal, no murmur, click, rub or gallop  Abdomen: soft, non-tender; bowel sounds normal; no masses,  no organomegaly   Vulva:  normal  Vagina: normal vagina, no discharge, exudate, lesion, or erythema; healed well  Cervix:  no cervical motion tenderness  Corpus: normal size, contour, position, consistency, mobility, non-tender  Adnexa:  normal adnexa  Rectal Exam: Not performed.    Past Medical History: Past Medical History  Diagnosis Date  . Hypertension   . Female circumcision   . Labial abscess    . AMA (advanced maternal age) multigravida 35+   . Gestational diabetes     diet controlled    Past Surgical History: Past Surgical History  Procedure Laterality Date  . No past surgeries      Family History: Family History  Problem Relation Age of Onset  . Hypertension Mother   . Diabetes Mother   . Hypertension Brother   . Hypertension Sister     Social History: History  Substance Use Topics  . Smoking status: Never Smoker   . Smokeless tobacco: Never Used  . Alcohol Use: No    Allergies: No Known Allergies  Her right arm, approximatly 4 inches proximal from the elbow, was cleansed with alcohol and anesthetized with 2cc of 2% Lidocaine.  The area was cleansed again and the Nexplanon was inserted without difficulty.  A pressure bandage was applied.  Pt was instructed to remove pressure bandage in a few hours, and keep insertion site covered with a bandaid for 3 days.  Back up contraception was recommended for 2 weeks.  Follow-up scheduled PRN problems   Assessment:     Normal postpartum exam. Pap smear not done at today's visit.   Nexplanon Insertion  Plan:    1. Contraception: Nexplanon 2. Follow-up PRN  Marlis Edelson, CNM

## 2014-10-15 LAB — WET PREP, GENITAL
Trich, Wet Prep: NONE SEEN
YEAST WET PREP: NONE SEEN

## 2014-10-18 ENCOUNTER — Telehealth: Payer: Self-pay | Admitting: Family

## 2014-10-18 NOTE — Telephone Encounter (Signed)
No answer when called to notify regarding bacterial vaginosis - will call back.    By Marlis Edelson, CNM

## 2014-11-09 NOTE — Telephone Encounter (Signed)
Called patient again to notify regarding bacterial vaginosis; unable to leave message (no voicemail).

## 2014-12-17 ENCOUNTER — Encounter (HOSPITAL_COMMUNITY): Payer: Self-pay | Admitting: Emergency Medicine

## 2014-12-17 ENCOUNTER — Emergency Department (INDEPENDENT_AMBULATORY_CARE_PROVIDER_SITE_OTHER)
Admission: EM | Admit: 2014-12-17 | Discharge: 2014-12-17 | Disposition: A | Discharge status: Home / Self Care | Payer: Self-pay | Source: Home / Self Care | Attending: Family Medicine | Admitting: Family Medicine

## 2014-12-17 DIAGNOSIS — J028 Acute pharyngitis due to other specified organisms: Secondary | ICD-10-CM

## 2014-12-17 LAB — POCT RAPID STREP A: STREPTOCOCCUS, GROUP A SCREEN (DIRECT): NEGATIVE

## 2014-12-17 MED ORDER — AMOXICILLIN 500 MG PO CAPS: 500.0000 mg | ORAL_CAPSULE | Freq: Three times a day (TID) | ORAL | Status: DC

## 2014-12-17 NOTE — ED Notes (Signed)
Sore throat, especially left tonsil, visible white on tonsil, c/o odor

## 2014-12-17 NOTE — ED Provider Notes (Signed)
CSN: [id number]     Arrival date & time 12/17/14  1544 History   First MD Initiated Contact with Patient 12/17/14 1616     Chief Complaint  Patient presents with  . Sore Throat   (Consider location/radiation/quality/duration/timing/severity/associated sxs/prior Treatment) Patient is a 39 y.o. female presenting with pharyngitis. The history is provided by the patient.  Sore Throat This is a new problem. The current episode started more than 2 days ago. The problem has been gradually worsening. The symptoms are aggravated by swallowing.    Past Medical History  Diagnosis Date  . Hypertension   . Female circumcision   . Labial abscess   . AMA (advanced maternal age) multigravida 35+   . Gestational diabetes     diet controlled   Past Surgical History  Procedure Laterality Date  . No past surgeries     Family History  Problem Relation Age of Onset  . Hypertension Mother   . Diabetes Mother   . Hypertension Brother   . Hypertension Sister    Social History  Substance Use Topics  . Smoking status: Never Smoker   . Smokeless tobacco: Never Used  . Alcohol Use: No   OB History    Gravida Para Term Preterm AB TAB SAB Ectopic Multiple Living   0 2     Review of Systems  Constitutional: Negative.  Negative for fever and chills.  HENT: Positive for postnasal drip, rhinorrhea and sore throat.   Respiratory: Negative.   Cardiovascular: Negative.     Allergies  Review of patient's allergies indicates no known allergies.  Home Medications   Prior to Admission medications   Medication Sig Start Date End Date Taking? Authorizing Provider  amoxicillin (AMOXIL) 500 MG capsule Take 1 capsule (500 mg total) by mouth 3 (three) times daily. 12/17/14   Linna Hoff, MD  ibuprofen (ADVIL,MOTRIN) 600 MG tablet Take 1 tablet (600 mg total) by mouth every 6 (six) hours. 09/03/14   Jacklyn Shell, CNM   BP 136/93 mmHg  Pulse 79  Temp(Src) 98.6 F (37 C) (Oral)   Resp 16  SpO2 97% Physical Exam  Constitutional: She is oriented to person, place, and time. She appears well-developed and well-nourished. No distress.  HENT:  Right Ear: External ear normal.  Left Ear: External ear normal.  Mouth/Throat: Oropharyngeal exudate present.  Eyes: Pupils are equal, round, and reactive to light.  Neck: Normal range of motion. Neck supple.  Lymphadenopathy:    She has no cervical adenopathy.  Neurological: She is alert and oriented to person, place, and time.  Skin: Skin is warm and dry.  Nursing note and vitals reviewed.   ED Course  Procedures (including critical care time) Labs Review Labs Reviewed  POCT RAPID STREP A    Imaging Review No results found.   MDM   1. Acute pharyngitis due to other specified organisms        Linna Hoff, MD 12/17/14 1651

## 2014-12-20 LAB — CULTURE, GROUP A STREP: Strep A Culture: NEGATIVE

## 2014-12-20 NOTE — ED Notes (Signed)
Final report of strep negative  

## 2016-08-13 ENCOUNTER — Ambulatory Visit (INDEPENDENT_AMBULATORY_CARE_PROVIDER_SITE_OTHER): Payer: BLUE CROSS/BLUE SHIELD | Admitting: Obstetrics and Gynecology

## 2016-08-13 ENCOUNTER — Encounter: Payer: Self-pay | Admitting: Obstetrics and Gynecology

## 2016-08-13 VITALS — BP 151/95 | HR 92 | Ht 66.0 in | Wt 150.8 lb

## 2016-08-13 DIAGNOSIS — N938 Other specified abnormal uterine and vaginal bleeding: Secondary | ICD-10-CM

## 2016-08-13 MED ORDER — NORETHINDRONE 0.35 MG PO TABS: 1.0000 | ORAL_TABLET | Freq: Every day | ORAL | 11 refills | Status: DC

## 2016-08-13 MED ORDER — PREPLUS 27-1 MG PO TABS
1.0000 | ORAL_TABLET | Freq: Every day | ORAL | 13 refills | Status: DC
Start: 2016-08-13 — End: 2016-11-21

## 2016-08-13 NOTE — Patient Instructions (Signed)
Hormonal Contraception Information Hormonal contraception is a type of birth control that uses hormones to prevent pregnancy. It usually involves a combination of the hormones estrogen and progesterone or only the hormone progesterone. Hormonal contraception works in these ways:  It thickens the mucus in the cervix, making it harder for sperm to enter the uterus.  It changes the lining of the uterus, making it harder for an egg to implant.  It may stop the ovaries from releasing eggs (ovulation). Some women who take hormonal contraceptives that contain only progesterone may continue to ovulate. Hormonal contraception cannot prevent sexually transmitted infections (STIs). Pregnancy may still occur. Estrogen and progesterone contraceptives  Contraceptives that use a combination of estrogen and progesterone are available in these forms:  Pill. Pills come in different combinations of hormones. They must be taken at the same time each day. Pills can affect your period, causing you to get your period once every three months or not at all.  Patch. The patch must be worn on the lower abdomen for three weeks and then removed on the fourth.  Vaginal ring. The ring is placed in the vagina and left there for three weeks. It is then removed for one week. Progesterone contraceptives Contraceptives that use progesterone only are available in these forms:  Pill. Pills should be taken every day of the cycle.  Intrauterine device (IUD). This device is inserted into the uterus and removed or replaced every five years or sooner.  Implant. Plastic rods are placed under the skin of the upper arm. They are removed or replaced every three years or sooner.  Injection. The injection is given once every 90 days. What are the side effects? The side effects of estrogen and progesterone contraceptives include:  Nausea.  Headaches.  Breast tenderness.  Bleeding or spotting between menstrual cycles.  High  blood pressure (rare).  Strokes, heart attacks, or blood clots (rare) Side effects of progesterone-only contraceptives include:  Nausea.  Headaches.  Breast tenderness.  Unpredictable menstrual bleeding.  High blood pressure (rare). Talk to your health care provider about what side effects may affect you. Where to find more information:  Ask your health care provider for more information and resources about hormonal contraception.  U[place]S. Department of Health and Cytogeneticist on Women's Health: http://hoffman.com/ Questions to ask:  What type of hormonal contraception is right for me?  How long should I plan to use hormonal contraception?  What are the side effects of the hormonal contraception method I choose?  How can I prevent STIs while using hormonal contraception? Contact a health care provider if:  You start taking hormonal contraceptives and you develop persistent or severe side effects. Summary  Estrogen and progesterone are hormones used in many forms of birth control.  Talk to your health care provider about what side effects may affect you.  Hormonal contraception cannot prevent sexually transmitted infections (STIs).  Ask your health care provider for more information and resources about hormonal contraception. This information is not intended to replace advice given to you by your health care provider. Make sure you discuss any questions you have with your health care provider. Document Released: 05/06/2007 Document Revised: 03/16/2016 Document Reviewed: 03/16/2016 Elsevier Interactive Patient Education  2017 Elsevier Inc.   Intrauterine Device Information An intrauterine device (IUD) is inserted into your uterus to prevent pregnancy. There are two types of IUDs available:  Copper IUD-This type of IUD is wrapped in copper wire and is placed inside the uterus. Copper makes the  uterus and fallopian tubes produce a fluid that kills sperm. The  copper IUD can stay in place for 10 years.  Hormone IUD-This type of IUD contains the hormone progestin (synthetic progesterone). The hormone thickens the cervical mucus and prevents sperm from entering the uterus. It also thins the uterine lining to prevent implantation of a fertilized egg. The hormone can weaken or kill the sperm that get into the uterus. One type of hormone IUD can stay in place for 5 years, and another type can stay in place for 3 years. Your health care provider will make sure you are a good candidate for a contraceptive IUD. Discuss with your health care provider the possible side effects. Advantages of an intrauterine device  IUDs are highly effective, reversible, long acting, and low maintenance.  There are no estrogen-related side effects.  An IUD can be used when breastfeeding.  IUDs are not associated with weight gain.  The copper IUD works immediately after insertion.  The hormone IUD works right away if inserted within 7 days of your period starting. You will need to use a backup method of birth control for 7 days if the hormone IUD is inserted at any other time in your cycle.  The copper IUD does not interfere with your female hormones.  The hormone IUD can make heavy menstrual periods lighter and decrease cramping.  The hormone IUD can be used for 3 or 5 years.  The copper IUD can be used for 10 years. Disadvantages of an intrauterine device  The hormone IUD can be associated with irregular bleeding patterns.  The copper IUD can make your menstrual flow heavier and more painful.  You may experience cramping and vaginal bleeding after insertion. This information is not intended to replace advice given to you by your health care provider. Make sure you discuss any questions you have with your health care provider. Document Released: 03/20/2004 Document Revised: 09/22/2015 Document Reviewed: 10/05/2012 Elsevier Interactive Patient Education  2017  [employer].

## 2016-08-13 NOTE — Progress Notes (Addendum)
41 yo G3P2012 presenting today to discuss contraception options. Patient has had the Nexplanon in place since 09/2014. She breast fed for a year and did not have a period. Upon cessation of breastfeeding she reports a monthly period lasting 2-3 weeks and heavy in flow. She denies any pelvic pain or abnormal discharge.   Past Medical History:  Diagnosis Date  . AMA (advanced maternal age) multigravida 35+   . Female circumcision   . Gestational diabetes    diet controlled  . Hypertension   . Labial abscess    Past Surgical History:  Procedure Laterality Date  . NO PAST SURGERIES     Family History  Problem Relation Age of Onset  . Hypertension Mother   . Diabetes Mother   . Hypertension Brother   . Hypertension Sister    Social History  Substance Use Topics  . Smoking status: Never Smoker  . Smokeless tobacco: Never Used  . Alcohol use No   ROS See pertinent in HPI  Blood pressure (!) 151/95, pulse 92, height  (1.676 m), weight 150 lb 12.8 oz (68.4 kg), last menstrual period 07/22/2016, not currently breastfeeding. GENERAL: Well-developed, well-nourished female in no acute distress.  EXTREMITIES: No cyanosis, clubbing, or edema, 2+ distal pulses. Nexplanon site intact  A/P 41 yo with DUB associated with Nexplanon - Discussed medical management with POP given HTN - Rx lyza provided - Patient to return in 3 months for annual exam and follow up on DUB and possible nexplanon removal. - Discussed other contraception options. Patient is also considering permanent sterilization - Advised patient to follow up with PCP given HTN - RTC in 3 months

## 2016-08-13 NOTE — Progress Notes (Signed)
Patient is in the office to discuss her response to the Implanon. She has had it 2 years in June. She has started having heavy and prolonged bleeding since she stopped breast feeding 5 months ago. She has hoped that it would get better- but it has not.  Patient wants to remove the implant and use OCP. Patient is due her pap smear.

## 2016-08-25 ENCOUNTER — Ambulatory Visit (INDEPENDENT_AMBULATORY_CARE_PROVIDER_SITE_OTHER): Payer: BLUE CROSS/BLUE SHIELD | Admitting: Physician Assistant

## 2016-08-25 ENCOUNTER — Encounter: Payer: Self-pay | Admitting: Physician Assistant

## 2016-08-25 VITALS — BP 128/87 | HR 110 | Temp 98.4°F | Resp 18 | Ht 67.0 in | Wt 147.0 lb

## 2016-08-25 DIAGNOSIS — J358 Other chronic diseases of tonsils and adenoids: Secondary | ICD-10-CM

## 2016-08-25 DIAGNOSIS — R000 Tachycardia, unspecified: Secondary | ICD-10-CM

## 2016-08-25 DIAGNOSIS — R070 Pain in throat: Secondary | ICD-10-CM

## 2016-08-25 LAB — POCT RAPID STREP A (OFFICE): RAPID STREP A SCREEN: NEGATIVE

## 2016-08-25 MED ORDER — AMOXICILLIN 875 MG PO TABS: 875.0000 mg | ORAL_TABLET | Freq: Two times a day (BID) | ORAL | 0 refills | Status: DC

## 2016-08-25 MED ORDER — KETOROLAC TROMETHAMINE 60 MG/2ML IM SOLN
60.0000 mg | Freq: Once | INTRAMUSCULAR | Status: AC
  Administered 2016-08-25: 60 mg via INTRAMUSCULAR

## 2016-08-25 MED ORDER — CEFTRIAXONE SODIUM 1 G IJ SOLR
1.0000 g | Freq: Once | INTRAMUSCULAR | Status: AC
  Administered 2016-08-25: 1 g via INTRAMUSCULAR

## 2016-08-25 NOTE — Patient Instructions (Addendum)
  Take 2 aleve morning and night for the next few days.  If you are not improving by Monday then come back to clinic.    IF you received an x-ray today, you will receive an invoice from [facility]. Please contact [facility] at [phone] with questions or concerns regarding your invoice.   IF you received labwork today, you will receive an invoice from [place]. Please contact [facility] at [phone] with questions or concerns regarding your invoice.   Our billing staff will not be able to assist you with questions regarding bills from these companies.  You will be contacted with the lab results as soon as they are available. The fastest way to get your results is to activate your My Chart account. Instructions are located on the last page of this paperwork. If you have not heard from [place] regarding the results in 2 weeks, please contact this office.

## 2016-08-25 NOTE — Progress Notes (Signed)
08/25/2016 9:47 AM   DOB: 1975-06-01 / MRN: [id number]  SUBJECTIVE:  Debra Delgado is a 41 y.o. female presenting for tonsil swelling that started yesterday.  She associates painful swallowing.  She had a fever yesterday along with generalized myalgia.  She feels that she is getting somewhat better.  She has tried 1 aleve yesterday with mild relief.   She has No Known Allergies.   She  has a past medical history of AMA (advanced maternal age) multigravida 35+; Female circumcision; Gestational diabetes; Hypertension; and Labial abscess.    She  reports that she has never smoked. She has never used smokeless tobacco. She reports that she does not drink alcohol or use drugs. She  reports that she currently engages in sexual activity and has had female partners. She reports using the following method of birth control/protection: Implant. The patient  has a past surgical history that includes No past surgeries.  Her family history includes Diabetes in her mother; Hypertension in her brother, mother, and sister.  Review of Systems  Constitutional: Positive for chills, fever and malaise/fatigue. Negative for diaphoresis.  Respiratory: Negative for shortness of breath.   Cardiovascular: Negative for chest pain.  Gastrointestinal: Negative for nausea.  Skin: Negative for rash.  Neurological: Negative for dizziness.    The problema list and medications were reviewed and updated by myself where necessary and exist elsewhere in the encounter.   OBJECTIVE:  BP 128/87   Pulse (!) 110   Temp 98.4 F (36.9 C) (Oral)   Resp 18   Ht  (1.702 m)   Wt 147 lb (66.7 kg)   SpO2 99%   BMI 23.02 kg/m   Physical Exam  Constitutional: She is active.  Non-toxic appearance. She has a sickly appearance. She appears ill. No distress.  HENT:  Mouth/Throat:    Cardiovascular: Normal rate, regular rhythm, S1 normal, S2 normal, normal heart sounds and intact distal pulses.  Exam reveals no gallop, no  friction rub and no decreased pulses.   No murmur heard. Pulmonary/Chest: Effort normal. No stridor. No tachypnea. No respiratory distress. She has no wheezes. She has no rales.  Abdominal: She exhibits no distension.  Musculoskeletal: She exhibits no edema.  Neurological: She is alert.  Skin: Skin is warm and dry. She is not diaphoretic. No pallor.    No results found for this or any previous visit (from the past 72 hour(s)).  No results found.  ASSESSMENT AND PLAN:  Debra Delgado was seen today for sore throat.  Diagnoses and all orders for this visit:  Tonsillar exudate: Gevn problem two I think she needs treatment now.  Im ceftriaxone.  Start amox at home.  2 Aleve morning and night.  RTC on Monday if not improving.  -     cefTRIAXone (ROCEPHIN) injection 1 g; Inject 1 g into the muscle once. -     amoxicillin (AMOXIL) 875 MG tablet; Take 1 tablet (875 mg total) by mouth 2 (two) times daily. -     Culture, Group A Strep -     POCT rapid strep A  Tachycardia -     cefTRIAXone (ROCEPHIN) injection 1 g; Inject 1 g into the muscle once.  Throat pain in adult -     ketorolac (TORADOL) injection 60 mg; Inject 2 mLs (60 mg total) into the muscle once.    The patient is advised to call or return to clinic if she does not see an improvement in symptoms, or to seek the care  of the closest emergency department if she worsens with the above plan.   Deliah Boston, MHS, PA-C [facility] 08/25/2016 9:47 AM

## 2016-08-28 LAB — CULTURE, GROUP A STREP: Strep A Culture: POSITIVE — AB

## 2016-08-28 NOTE — Progress Notes (Signed)
Please call and advise she finish abx as her culture is positive. Deliah Boston, MS, PA-C 8:10 AM, 08/28/2016

## 2016-11-21 ENCOUNTER — Ambulatory Visit (INDEPENDENT_AMBULATORY_CARE_PROVIDER_SITE_OTHER): Payer: BLUE CROSS/BLUE SHIELD | Admitting: Urgent Care

## 2016-11-21 ENCOUNTER — Encounter: Payer: Self-pay | Admitting: Urgent Care

## 2016-11-21 VITALS — BP 141/90 | HR 71 | Temp 98.9°F | Resp 17 | Ht 66.5 in | Wt 147.0 lb

## 2016-11-21 DIAGNOSIS — R1013 Epigastric pain: Secondary | ICD-10-CM

## 2016-11-21 DIAGNOSIS — R030 Elevated blood-pressure reading, without diagnosis of hypertension: Secondary | ICD-10-CM | POA: Diagnosis not present

## 2016-11-21 DIAGNOSIS — R319 Hematuria, unspecified: Secondary | ICD-10-CM | POA: Diagnosis not present

## 2016-11-21 LAB — POCT CBC
Granulocyte percent: 40.9 %G (ref 37–80)
HCT, POC: 39.7 % (ref 37.7–47.9)
Hemoglobin: 12.9 g/dL (ref 12.2–16.2)
Lymph, poc: 3.1 (ref 0.6–3.4)
MCH, POC: 25.2 pg — AB (ref 27–31.2)
MCHC: 32.4 g/dL (ref 31.8–35.4)
MCV: 78 fL — AB (ref 80–97)
MID (CBC): 0.3 (ref 0–0.9)
MPV: 7.6 fL (ref 0–99.8)
PLATELET COUNT, POC: 297 10*3/uL (ref 142–424)
POC Granulocyte: 2.3 (ref 2–6.9)
POC LYMPH %: 54.7 % — AB (ref 10–50)
POC MID %: 4.4 %M (ref 0–12)
RBC: 5.09 M/uL (ref 4.04–5.48)
RDW, POC: 14.4 %
WBC: 5.7 10*3/uL (ref 4.6–10.2)

## 2016-11-21 LAB — POCT URINALYSIS DIP (MANUAL ENTRY)
Bilirubin, UA: NEGATIVE
GLUCOSE UA: NEGATIVE mg/dL
Ketones, POC UA: NEGATIVE mg/dL
Leukocytes, UA: NEGATIVE
NITRITE UA: NEGATIVE
Protein Ur, POC: NEGATIVE mg/dL
Spec Grav, UA: 1.01 (ref 1.010–1.025)
UROBILINOGEN UA: 0.2 U/dL
pH, UA: 6 (ref 5.0–8.0)

## 2016-11-21 LAB — POCT URINE PREGNANCY: Preg Test, Ur: NEGATIVE

## 2016-11-21 MED ORDER — OMEPRAZOLE 20 MG PO CPDR: 20.0000 mg | DELAYED_RELEASE_CAPSULE | Freq: Every day | ORAL | 3 refills | Status: AC

## 2016-11-21 MED ORDER — GI COCKTAIL ~~LOC~~: 30.0000 mL | Freq: Once | ORAL | Status: AC

## 2016-11-21 MED ORDER — RANITIDINE HCL 150 MG PO TABS
150.0000 mg | ORAL_TABLET | Freq: Two times a day (BID) | ORAL | 0 refills | Status: AC
Start: 2016-11-21 — End: ?

## 2016-11-21 NOTE — Progress Notes (Signed)
MRN: [id number] DOB: 08/31/1975  Subjective:   Debra Delgado is a 41 y.o. female presenting for chief complaint of Abdominal Pain  Reports 1 week history constant epigastric pain, pressure type sensation around her stomach. Eating can make her pain a little better. Becoming aggravated emotionally also worsens the epigastric pain. Has not tried any medications for her symptoms. Denies fever, n/v, dysuria, hematuria, diarrhea, loose stools, bloody stools. Denies smoking cigarettes. Patient last took amoxicillin in 07/2016 for tonsillar exudates.  Nadean Corwinsmhan has a current medication list which includes the following prescription(s): norethindrone. Also has No Known Allergies.  Nadean Corwinsmhan  has a past medical history of AMA (advanced maternal age) multigravida 35+; Female circumcision; Gestational diabetes; Hypertension; and Labial abscess. Also  has a past surgical history that includes No past surgeries.  Objective:   Vitals: BP (!) 141/90   Pulse 71   Temp 98.9 F (37.2 C) (Oral)   Resp 17   Ht 5' 6.5" (1.689 m)   Wt 147 lb (66.7 kg)   SpO2 98%   BMI 23.37 kg/m   BP Readings from Last 3 Encounters:  11/21/16 (!) 141/90  08/25/16 128/87  08/13/16 (!) 151/95    Physical Exam  Constitutional: She is oriented to person, place, and time. She appears well-developed and well-nourished.  HENT:  Mouth/Throat: Oropharynx is clear and moist.  Eyes: No scleral icterus.  Cardiovascular: Normal rate, regular rhythm and intact distal pulses.  Exam reveals no gallop and no friction rub.   No murmur heard. Pulmonary/Chest: No respiratory distress. She has no wheezes. She has no rales.  Abdominal: Soft. Bowel sounds are normal. She exhibits no distension and no mass. There is tenderness (epigastric, near xyphoid process). There is no guarding.  Neurological: She is alert and oriented to person, place, and time.  Skin: Skin is warm and dry.   Results for orders placed or performed in visit on 11/21/16  (from the past 24 hour(s))  POCT CBC     Status: Abnormal   Collection Time: 11/21/16  4:19 PM  Result Value Ref Range   WBC 5.7 4.6 - 10.2 K/uL   Lymph, poc 3.1 0.6 - 3.4   POC LYMPH PERCENT 54.7 (A) 10 - 50 %L   MID (cbc) 0.3 0 - 0.9   POC MID % 4.4 0 - 12 %M   POC Granulocyte 2.3 2 - 6.9   Granulocyte percent 40.9 37 - 80 %G   RBC 5.09 4.04 - 5.48 M/uL   Hemoglobin 12.9 12.2 - 16.2 g/dL   HCT, POC 63.839.7 75.637.7 - 47.9 %   MCV 78.0 (A) 80 - 97 fL   MCH, POC 25.2 (A) 27 - 31.2 pg   MCHC 32.4 31.8 - 35.4 g/dL   RDW, POC 43.314.4 %   Platelet Count, POC 297 142 - 424 K/uL   MPV 7.6 0 - 99.8 fL  POCT urine pregnancy     Status: None   Collection Time: 11/21/16  4:25 PM  Result Value Ref Range   Preg Test, Ur Negative Negative  POCT urinalysis dipstick     Status: Abnormal   Collection Time: 11/21/16  4:26 PM  Result Value Ref Range   Color, UA yellow yellow   Clarity, UA clear clear   Glucose, UA negative negative mg/dL   Bilirubin, UA negative negative   Ketones, POC UA negative negative mg/dL   Spec Grav, UA 2.9511.010 8.8411.010 - 1.025   Blood, UA moderate (A) negative   pH, UA 6.0  5.0 - 8.0   Protein Ur, POC negative negative mg/dL   Urobilinogen, UA 0.2 0.2 or 1.0 E.U./dL   Nitrite, UA Negative Negative   Leukocytes, UA Negative Negative   Assessment and Plan :   1. Epigastric pain - Will manage as GERD, labs pending. Patient is to start Zantac with Prilosec, stop Zantac in a week. Avoid GERD causing foods. Return-to-clinic precautions discussed, patient verbalized understanding.   2. Elevated blood pressure reading 3. Hematuria, unspecified type - Recheck in 4 weeks.  Wallis BambergMario Tranae Laramie, PA-C [facility] [phone] 11/21/2016  4:06 PM

## 2016-11-21 NOTE — Patient Instructions (Addendum)
Gastroesophageal Reflux Disease, Adult Normally, food travels down the esophagus and stays in the stomach to be digested. However, when a person has gastroesophageal reflux disease (GERD), food and stomach acid move back up into the esophagus. When this happens, the esophagus becomes sore and inflamed. Over time, GERD can create small holes (ulcers) in the lining of the esophagus. What are the causes? This condition is caused by a problem with the muscle between the esophagus and the stomach (lower esophageal sphincter, or LES). Normally, the LES muscle closes after food passes through the esophagus to the stomach. When the LES is weakened or abnormal, it does not close properly, and that allows food and stomach acid to go back up into the esophagus. The LES can be weakened by certain dietary substances, medicines, and medical conditions, including:  Tobacco use.  Pregnancy.  Having a hiatal hernia.  Heavy alcohol use.  Certain foods and beverages, such as coffee, chocolate, onions, and peppermint.  What increases the risk? This condition is more likely to develop in:  People who have an increased body weight.  People who have connective tissue disorders.  People who use NSAID medicines.  What are the signs or symptoms? Symptoms of this condition include:  Heartburn.  Difficult or painful swallowing.  The feeling of having a lump in the throat.  Abitter taste in the mouth.  Bad breath.  Having a large amount of saliva.  Having an upset or bloated stomach.  Belching.  Chest pain.  Shortness of breath or wheezing.  Ongoing (chronic) cough or a night-time cough.  Wearing away of tooth enamel.  Weight loss.  Different conditions can cause chest pain. Make sure to see your health care provider if you experience chest pain. How is this diagnosed? Your health care provider will take a medical history and perform a physical exam. To determine if you have mild or severe  GERD, your health care provider may also monitor how you respond to treatment. You may also have other tests, including:  An endoscopy toexamine your stomach and esophagus with a small camera.  A test thatmeasures the acidity level in your esophagus.  A test thatmeasures how much pressure is on your esophagus.  A barium swallow or modified barium swallow to show the shape, size, and functioning of your esophagus.  How is this treated? The goal of treatment is to help relieve your symptoms and to prevent complications. Treatment for this condition may vary depending on how severe your symptoms are. Your health care provider may recommend:  Changes to your diet.  Medicine.  Surgery.  Follow these instructions at home: Diet  Follow a diet as recommended by your health care provider. This may involve avoiding foods and drinks such as: ? Coffee and tea (with or without caffeine). ? Drinks that containalcohol. ? Energy drinks and sports drinks. ? Carbonated drinks or sodas. ? Chocolate and cocoa. ? Peppermint and mint flavorings. ? Garlic and onions. ? Horseradish. ? Spicy and acidic foods, including peppers, chili powder, curry powder, vinegar, hot sauces, and barbecue sauce. ? Citrus fruit juices and citrus fruits, such as oranges, lemons, and limes. ? Tomato-based foods, such as red sauce, chili, salsa, and pizza with red sauce. ? Fried and fatty foods, such as donuts, french fries, potato chips, and high-fat dressings. ? High-fat meats, such as hot dogs and fatty cuts of red and white meats, such as rib eye steak, sausage, ham, and bacon. ? High-fat dairy items, such as whole milk,   butter, and cream cheese.  Eat small, frequent meals instead of large meals.  Avoid drinking large amounts of liquid with your meals.  Avoid eating meals during the 2-3 hours before bedtime.  Avoid lying down right after you eat.  Do not exercise right after you eat. General  instructions  Pay attention to any changes in your symptoms.  Take over-the-counter and prescription medicines only as told by your health care provider. Do not take aspirin, ibuprofen, or other NSAIDs unless your health care provider told you to do so.  Do not use any tobacco products, including cigarettes, chewing tobacco, and e-cigarettes. If you need help quitting, ask your health care provider.  Wear loose-fitting clothing. Do not wear anything tight around your waist that causes pressure on your abdomen.  Raise (elevate) the head of your bed 6 inches (15cm).  Try to reduce your stress, such as with yoga or meditation. If you need help reducing stress, ask your health care provider.  If you are overweight, reduce your weight to an amount that is healthy for you. Ask your health care provider for guidance about a safe weight loss goal.  Keep all follow-up visits as told by your health care provider. This is important. Contact a health care provider if:  You have new symptoms.  You have unexplained weight loss.  You have difficulty swallowing, or it hurts to swallow.  You have wheezing or a persistent cough.  Your symptoms do not improve with treatment.  You have a hoarse voice. Get help right away if:  You have pain in your arms, neck, jaw, teeth, or back.  You feel sweaty, dizzy, or light-headed.  You have chest pain or shortness of breath.  You vomit and your vomit looks like blood or coffee grounds.  You faint.  Your stool is bloody or black.  You cannot swallow, drink, or eat. This information is not intended to replace advice given to you by your health care provider. Make sure you discuss any questions you have with your health care provider. Document Released: 01/24/2005 Document Revised: 09/14/2015 Document Reviewed: 08/11/2014 Elsevier Interactive Patient Education  2017 [employer]     IF you received an x-ray today, you will receive an invoice  from [facility]. Please contact [facility] at [phone] with questions or concerns regarding your invoice.   IF you received labwork today, you will receive an invoice from [facility]. Please contact [facility] at [phone] with questions or concerns regarding your invoice.   Our billing staff will not be able to assist you with questions regarding bills from these companies.  You will be contacted with the lab results as soon as they are available. The fastest way to get your results is to activate your My Chart account. Instructions are located on the last page of this paperwork. If you have not heard from us regarding the results in 2 weeks, please contact this office.

## 2016-11-22 LAB — COMPREHENSIVE METABOLIC PANEL
A/G RATIO: 2 (ref 1.2–2.2)
ALBUMIN: 5 g/dL (ref 3.5–5.5)
ALK PHOS: 70 IU/L (ref 39–117)
ALT: 7 IU/L (ref 0–32)
AST: 15 IU/L (ref 0–40)
BUN/Creatinine Ratio: 20 (ref 9–23)
BUN: 10 mg/dL (ref 6–24)
Bilirubin Total: <0.2 mg/dL (ref 0.0–1.2)
CHLORIDE: 102 mmol/L (ref 96–106)
CO2: 25 mmol/L (ref 20–29)
Calcium: 9.7 mg/dL (ref 8.7–10.2)
Creatinine, Ser: 0.51 mg/dL — ABNORMAL LOW (ref 0.57–1.00)
GFR calc non Af Amer: 120 mL/min/{1.73_m2} (ref >59)
GFR, EST AFRICAN AMERICAN: 138 mL/min/{1.73_m2} (ref >59)
GLUCOSE: 89 mg/dL (ref 65–99)
Globulin, Total: 2.5 g/dL (ref 1.5–4.5)
Potassium: 4.1 mmol/L (ref 3.5–5.2)
SODIUM: 140 mmol/L (ref 134–144)
Total Protein: 7.5 g/dL (ref 6.0–8.5)

## 2016-11-22 LAB — H. PYLORI BREATH TEST: H PYLORI BREATH TEST: NEGATIVE

## 2016-12-04 ENCOUNTER — Encounter: Payer: Self-pay | Admitting: Obstetrics and Gynecology

## 2016-12-04 ENCOUNTER — Ambulatory Visit (INDEPENDENT_AMBULATORY_CARE_PROVIDER_SITE_OTHER): Payer: BLUE CROSS/BLUE SHIELD | Admitting: Obstetrics and Gynecology

## 2016-12-04 VITALS — BP 141/94 | HR 100 | Ht 66.0 in | Wt 148.5 lb

## 2016-12-04 DIAGNOSIS — Z124 Encounter for screening for malignant neoplasm of cervix: Secondary | ICD-10-CM

## 2016-12-04 DIAGNOSIS — Z1151 Encounter for screening for human papillomavirus (HPV): Secondary | ICD-10-CM | POA: Diagnosis not present

## 2016-12-04 DIAGNOSIS — Z3046 Encounter for surveillance of implantable subdermal contraceptive: Secondary | ICD-10-CM

## 2016-12-04 DIAGNOSIS — Z01419 Encounter for gynecological examination (general) (routine) without abnormal findings: Secondary | ICD-10-CM

## 2016-12-04 NOTE — Progress Notes (Signed)
Subjective:     Debra Delgado is a 41 y.o. female [phone] with BMI 23 who is here for a comprehensive physical exam. The patient reports persistent vaginal bleeding with Nexplanon and desire to have it removes. She denies any other complaints. She is sexually active and plans to use condoms until she decides which contraception she would like to use. She denies any pelvic pain or abnormal discharge.  Past Medical History:  Diagnosis Date  . AMA (advanced maternal age) multigravida 35+   . Female circumcision   . Gestational diabetes    diet controlled  . Hypertension   . Labial abscess    Past Surgical History:  Procedure Laterality Date  . NO PAST SURGERIES     Family History  Problem Relation Age of Onset  . Hypertension Mother   . Diabetes Mother   . Hypertension Brother   . Hypertension Sister     Social History   Social History  . Marital status: Married    Spouse name: N/A  . Number of children: N/A  . Years of education: N/A   Occupational History  . Not on file.   Social History Main Topics  . Smoking status: Never Smoker  . Smokeless tobacco: Never Used  . Alcohol use No  . Drug use: No  . Sexual activity: Yes    Partners: Male    Birth control/ protection: Implant   Other Topics Concern  . Not on file   Social History Narrative  . No narrative on file   Health Maintenance  Topic Date Due  . HEMOGLOBIN A1C  Jan 19, 1976  . PNEUMOCOCCAL POLYSACCHARIDE VACCINE (1) 04/30/1977  . FOOT EXAM  04/30/1985  . OPHTHALMOLOGY EXAM  04/30/1985  . URINE MICROALBUMIN  04/30/1985  . PAP SMEAR  04/30/1996  . INFLUENZA VACCINE  11/28/2016  . TETANUS/TDAP  09/01/2024  . HIV Screening  Completed       Review of Systems Pertinent items are noted in HPI.   Objective:  Blood pressure (!) 141/94, pulse 100, height 5\' 6"  (1.676 m), weight 148 lb 8 oz (67.4 kg).     GENERAL: Well-developed, well-nourished female in no acute distress.  HEENT: Normocephalic,  atraumatic. Sclerae anicteric.  NECK: Supple. Normal thyroid.  LUNGS: Clear to auscultation bilaterally.  HEART: Regular rate and rhythm. BREASTS: Symmetric in size. No palpable masses or lymphadenopathy, skin changes, or nipple drainage. ABDOMEN: Soft, nontender, nondistended.  PELVIC: Normal external female genitalia. Vagina is pink and rugated.  Normal discharge. Normal appearing cervix. Uterus is normal in size. No adnexal mass or tenderness. EXTREMITIES: No cyanosis, clubbing, or edema, 2+ distal pulses.     Assessment:    Healthy female exam.      Plan:     A/P 41 yo [id number] here for annual exam and nexplanon removal - pap smear collected - Screening mammogram ordered - Discussed high blood pressure and need to follow up with PCP ASAP for further management. Patient states that her BP is only elevated when she is upset about something. Education provided and patient agrees to see PCP - Nexplanon Removal Patient given informed consent for removal of her Nexplanon, time out was performed.  Signed copy in the chart.  Appropriate time out taken. Nexplanon site identified.  Area prepped in usual sterile fashon. One cc of 1% lidocaine was used to anesthetize the area at the distal end of the implant. A small stab incision was made right beside the implant on the distal portion.  The  Nexplanon rod was grasped using hemostats and removed without difficulty.  There was less than 3 cc blood loss. There were no complications.  A small amount of antibiotic ointment and steri-strips were applied over the small incision.  A pressure bandage was applied to reduce any bruising.  The patient tolerated the procedure well and was given post procedure instructions.  Patient to return for contraception when ready. Plans condoms for now See After Visit Summary for Counseling Recommendations

## 2016-12-04 NOTE — Patient Instructions (Signed)
Contraception Choices Contraception (birth control) is the use of any methods or devices to prevent pregnancy. Below are some methods to help avoid pregnancy. Hormonal methods  Contraceptive implant. This is a thin, plastic tube containing progesterone hormone. It does not contain estrogen hormone. Your health care provider inserts the tube in the inner part of the upper arm. The tube can remain in place for up to 3 years. After 3 years, the implant must be removed. The implant prevents the ovaries from releasing an egg (ovulation), thickens the cervical mucus to prevent sperm from entering the uterus, and thins the lining of the inside of the uterus.  Progesterone-only injections. These injections are given every 3 months by your health care provider to prevent pregnancy. This synthetic progesterone hormone stops the ovaries from releasing eggs. It also thickens cervical mucus and changes the uterine lining. This makes it harder for sperm to survive in the uterus.  Birth control pills. These pills contain estrogen and progesterone hormone. They work by preventing the ovaries from releasing eggs (ovulation). They also cause the cervical mucus to thicken, preventing the sperm from entering the uterus. Birth control pills are prescribed by a health care provider.Birth control pills can also be used to treat heavy periods.  Minipill. This type of birth control pill contains only the progesterone hormone. They are taken every day of each month and must be prescribed by your health care provider.  Birth control patch. The patch contains hormones similar to those in birth control pills. It must be changed once a week and is prescribed by a health care provider.  Vaginal ring. The ring contains hormones similar to those in birth control pills. It is left in the vagina for 3 weeks, removed for 1 week, and then a new one is put back in place. The patient must be comfortable inserting and removing the ring from  the vagina.A health care provider's prescription is necessary.  Emergency contraception. Emergency contraceptives prevent pregnancy after unprotected sexual intercourse. This pill can be taken right after sex or up to 5 days after unprotected sex. It is most effective the sooner you take the pills after having sexual intercourse. Most emergency contraceptive pills are available without a prescription. Check with your pharmacist. Do not use emergency contraception as your only form of birth control. Barrier methods  Female condom. This is a thin sheath (latex or rubber) that is worn over the penis during sexual intercourse. It can be used with spermicide to increase effectiveness.  Female condom. This is a soft, loose-fitting sheath that is put into the vagina before sexual intercourse.  Diaphragm. This is a soft, latex, dome-shaped barrier that must be fitted by a health care provider. It is inserted into the vagina, along with a spermicidal jelly. It is inserted before intercourse. The diaphragm should be left in the vagina for 6 to 8 hours after intercourse.  Cervical cap. This is a round, soft, latex or plastic cup that fits over the cervix and must be fitted by a health care provider. The cap can be left in place for up to 48 hours after intercourse.  Sponge. This is a soft, circular piece of polyurethane foam. The sponge has spermicide in it. It is inserted into the vagina after wetting it and before sexual intercourse.  Spermicides. These are chemicals that kill or block sperm from entering the cervix and uterus. They come in the form of creams, jellies, suppositories, foam, or tablets. They do not require a prescription. They   are inserted into the vagina with an applicator before having sexual intercourse. The process must be repeated every time you have sexual intercourse. Intrauterine contraception  Intrauterine device (IUD). This is a T-shaped device that is put in a woman's uterus during  a menstrual period to prevent pregnancy. There are 2 types: ? Copper IUD. This type of IUD is wrapped in copper wire and is placed inside the uterus. Copper makes the uterus and fallopian tubes produce a fluid that kills sperm. It can stay in place for 10 years. ? Hormone IUD. This type of IUD contains the hormone progestin (synthetic progesterone). The hormone thickens the cervical mucus and prevents sperm from entering the uterus, and it also thins the uterine lining to prevent implantation of a fertilized egg. The hormone can weaken or kill the sperm that get into the uterus. It can stay in place for 3-5 years, depending on which type of IUD is used. Permanent methods of contraception  Female tubal ligation. This is when the woman's fallopian tubes are surgically sealed, tied, or blocked to prevent the egg from traveling to the uterus.  Hysteroscopic sterilization. This involves placing a small coil or insert into each fallopian tube. Your doctor uses a technique called hysteroscopy to do the procedure. The device causes scar tissue to form. This results in permanent blockage of the fallopian tubes, so the sperm cannot fertilize the egg. It takes about 3 months after the procedure for the tubes to become blocked. You must use another form of birth control for these 3 months.  Female sterilization. This is when the female has the tubes that carry sperm tied off (vasectomy).This blocks sperm from entering the vagina during sexual intercourse. After the procedure, the man can still ejaculate fluid (semen). Natural planning methods  Natural family planning. This is not having sexual intercourse or using a barrier method (condom, diaphragm, cervical cap) on days the woman could become pregnant.  Calendar method. This is keeping track of the length of each menstrual cycle and identifying when you are fertile.  Ovulation method. This is avoiding sexual intercourse during ovulation.  Symptothermal method.  This is avoiding sexual intercourse during ovulation, using a thermometer and ovulation symptoms.  Post-ovulation method. This is timing sexual intercourse after you have ovulated. Regardless of which type or method of contraception you choose, it is important that you use condoms to protect against the transmission of sexually transmitted infections (STIs). Talk with your health care provider about which form of contraception is most appropriate for you. This information is not intended to replace advice given to you by your health care provider. Make sure you discuss any questions you have with your health care provider. Document Released: 04/16/2005 Document Revised: 09/22/2015 Document Reviewed: 10/09/2012 [employer] Interactive Patient Education  2017 [employer]  

## 2016-12-05 LAB — CYTOLOGY - PAP: Diagnosis: NEGATIVE

## 2016-12-18 ENCOUNTER — Encounter: Payer: Self-pay | Admitting: Obstetrics and Gynecology

## 2020-09-08 ENCOUNTER — Emergency Department (HOSPITAL_COMMUNITY)
Admission: EM | Admit: 2020-09-08 | Discharge: 2020-09-08 | Disposition: A | Discharge status: Home / Self Care | Payer: 59 | Attending: Emergency Medicine | Admitting: Emergency Medicine

## 2020-09-08 ENCOUNTER — Other Ambulatory Visit: Payer: Self-pay

## 2020-09-08 ENCOUNTER — Encounter (HOSPITAL_COMMUNITY): Payer: Self-pay | Admitting: Emergency Medicine

## 2020-09-08 DIAGNOSIS — R109 Unspecified abdominal pain: Secondary | ICD-10-CM | POA: Insufficient documentation

## 2020-09-08 DIAGNOSIS — M79602 Pain in left arm: Secondary | ICD-10-CM | POA: Diagnosis not present

## 2020-09-08 DIAGNOSIS — Z5321 Procedure and treatment not carried out due to patient leaving prior to being seen by health care provider: Secondary | ICD-10-CM | POA: Diagnosis not present

## 2020-09-08 LAB — COMPREHENSIVE METABOLIC PANEL
ALT: 49 U/L — ABNORMAL HIGH (ref 0–44)
AST: 99 U/L — ABNORMAL HIGH (ref 15–41)
Albumin: 4.1 g/dL (ref 3.5–5.0)
Alkaline Phosphatase: 112 U/L (ref 38–126)
Anion gap: 6 (ref 5–15)
BUN: 14 mg/dL (ref 6–20)
CO2: 28 mmol/L (ref 22–32)
Calcium: 9.2 mg/dL (ref 8.9–10.3)
Chloride: 104 mmol/L (ref 98–111)
Creatinine, Ser: 0.54 mg/dL (ref 0.44–1.00)
GFR, Estimated: >60 mL/min (ref >60)
Glucose, Bld: 151 mg/dL — ABNORMAL HIGH (ref 70–99)
Potassium: 3.9 mmol/L (ref 3.5–5.1)
Sodium: 138 mmol/L (ref 135–145)
Total Bilirubin: 0.4 mg/dL (ref 0.3–1.2)
Total Protein: 6.8 g/dL (ref 6.5–8.1)

## 2020-09-08 LAB — CBC
HCT: 41.4 % (ref 36.0–46.0)
Hemoglobin: 12.8 g/dL (ref 12.0–15.0)
MCH: 25.3 pg — ABNORMAL LOW (ref 26.0–34.0)
MCHC: 30.9 g/dL (ref 30.0–36.0)
MCV: 82 fL (ref 80.0–100.0)
Platelets: 304 10*3/uL (ref 150–400)
RBC: 5.05 MIL/uL (ref 3.87–5.11)
RDW: 14.1 % (ref 11.5–15.5)
WBC: 6 10*3/uL (ref 4.0–10.5)
nRBC: 0 % (ref 0.0–0.2)

## 2020-09-08 LAB — TROPONIN I (HIGH SENSITIVITY): Troponin I (High Sensitivity): 3 ng/L (ref <18)

## 2020-09-08 LAB — I-STAT BETA HCG BLOOD, ED (MC, WL, AP ONLY): I-stat hCG, quantitative: <5 m[IU]/mL (ref <5)

## 2020-09-08 LAB — LIPASE, BLOOD: Lipase: 35 U/L (ref 11–51)

## 2020-09-08 NOTE — ED Triage Notes (Signed)
Pt complains of abd pain that started today. Denies n/v. Pt complains of left sided arm pain into chest as well. SR HR 80, 100% on room air, BP 150/76

## 2020-09-08 NOTE — ED Notes (Signed)
pts iv was taken out and requested to be taken out of the system because she has to go pick up her child from school.
